# Patient Record
Sex: Female | Born: 1965 | Hispanic: No | Marital: Married | Smoking: Never smoker
Health system: Southern US, Community
[De-identification: ages and names within clinical notes are randomized; demographics above are authoritative.]

## PROBLEM LIST (undated history)

## (undated) DIAGNOSIS — K219 Gastro-esophageal reflux disease without esophagitis: Secondary | ICD-10-CM

## (undated) HISTORY — DX: Gastro-esophageal reflux disease without esophagitis: K21.9

---

## 2005-07-06 ENCOUNTER — Ambulatory Visit: Payer: Self-pay

## 2006-04-18 ENCOUNTER — Ambulatory Visit: Payer: Self-pay

## 2006-09-06 ENCOUNTER — Ambulatory Visit: Payer: Self-pay | Admitting: Surgery

## 2007-10-24 ENCOUNTER — Ambulatory Visit: Payer: Self-pay | Admitting: Internal Medicine

## 2007-11-17 ENCOUNTER — Emergency Department: Payer: Self-pay | Admitting: Emergency Medicine

## 2008-01-29 ENCOUNTER — Ambulatory Visit: Payer: Self-pay | Admitting: Internal Medicine

## 2008-01-31 ENCOUNTER — Ambulatory Visit: Payer: Self-pay | Admitting: Internal Medicine

## 2008-10-23 ENCOUNTER — Ambulatory Visit: Payer: Self-pay | Admitting: Internal Medicine

## 2009-12-15 ENCOUNTER — Ambulatory Visit: Payer: Self-pay

## 2011-03-08 ENCOUNTER — Ambulatory Visit: Payer: Self-pay

## 2011-09-08 ENCOUNTER — Ambulatory Visit (INDEPENDENT_AMBULATORY_CARE_PROVIDER_SITE_OTHER): Payer: BC Managed Care – PPO | Admitting: Internal Medicine

## 2011-09-08 ENCOUNTER — Encounter: Payer: Self-pay | Admitting: Internal Medicine

## 2011-09-08 DIAGNOSIS — N911 Secondary amenorrhea: Secondary | ICD-10-CM | POA: Insufficient documentation

## 2011-09-08 DIAGNOSIS — N912 Amenorrhea, unspecified: Secondary | ICD-10-CM

## 2011-09-08 LAB — POCT URINE PREGNANCY: Preg Test, Ur: NEGATIVE

## 2011-09-08 NOTE — Patient Instructions (Addendum)
I am repeating your liver tests and thyroid tests and checking your [facility]?LH to see if you are entering menopause  You can lower your triglycerides by reducing starches in your diet  Try [employer]  brand pita bread and flatbread ([employer] and [employer])

## 2011-09-08 NOTE — Progress Notes (Signed)
  Subjective:    Patient ID: Laura Holden, female    DOB: 01-10-1966, 46 y.o.   MRN: [id number]  HPI  46 yo woman presetns with secondary amenorrhea.  Had a normal menses in  dec, hasn't had one since then.  UPT was negative two weeks ago.   No breast tenderness or discharge, just back pain that comes and goes, improves with rest. No urinary symptoms. Last PAP/pelvic 2 years ago   TSH was elevated in October no repeat done.   No change in weight .  History of invitro fertilizatin over 11 yrs ago resulting in successful pregnancy,  Had a second child conceived and carried to term without hormonal manipulation .  No past medical history on file.  No current outpatient prescriptions on file prior to visit.      Review of Systems  Constitutional: Negative for fever, chills and unexpected weight change.  HENT: Negative for hearing loss, ear pain, nosebleeds, congestion, sore throat, facial swelling, rhinorrhea, sneezing, mouth sores, trouble swallowing, neck pain, neck stiffness, voice change, postnasal drip, sinus pressure, tinnitus and ear discharge.   Eyes: Negative for pain, discharge, redness and visual disturbance.  Respiratory: Negative for cough, chest tightness, shortness of breath, wheezing and stridor.   Cardiovascular: Negative for chest pain, palpitations and leg swelling.  Genitourinary: Positive for menstrual problem.  Musculoskeletal: Negative for myalgias and arthralgias.  Skin: Negative for color change and rash.  Neurological: Negative for dizziness, weakness, light-headedness and headaches.  Hematological: Negative for adenopathy.       Objective:   Physical Exam  Constitutional: She is oriented to person, place, and time. She appears well-developed and well-nourished.  HENT:  Mouth/Throat: Oropharynx is clear and moist.  Eyes: EOM are normal. Pupils are equal, round, and reactive to light. No scleral icterus.  Neck: Normal range of motion. Neck supple. No JVD present.  No thyromegaly present.  Cardiovascular: Normal rate, regular rhythm, normal heart sounds and intact distal pulses.   Pulmonary/Chest: Effort normal and breath sounds normal.  Abdominal: Soft. Bowel sounds are normal. She exhibits no mass. There is no tenderness.  Musculoskeletal: Normal range of motion. She exhibits no edema.  Lymphadenopathy:    She has no cervical adenopathy.  Neurological: She is alert and oriented to person, place, and time.  Skin: Skin is warm and dry.  Psychiatric: She has a normal mood and affect.          Assessment & Plan:

## 2011-09-08 NOTE — Assessment & Plan Note (Signed)
Likely secondary to perimenopausal state. Which she has a history of elevated TSH screening in October. We'll repeat TSH free T4 FSH and LH. Repeat urine test today was negative.

## 2011-09-09 ENCOUNTER — Other Ambulatory Visit: Payer: Self-pay | Admitting: Internal Medicine

## 2011-09-09 LAB — HEPATIC FUNCTION PANEL A (ARMC)
Bilirubin, Direct: <0.1 mg/dL (ref 0.00–0.20)
Bilirubin,Total: 0.6 mg/dL (ref 0.2–1.0)
SGOT(AST): 41 U/L — ABNORMAL HIGH (ref 15–37)
SGPT (ALT): 65 U/L

## 2011-09-14 ENCOUNTER — Telehealth: Payer: Self-pay | Admitting: Internal Medicine

## 2011-09-14 NOTE — Telephone Encounter (Signed)
Patient wants lab results

## 2011-09-16 NOTE — Telephone Encounter (Signed)
Pt called wanting to get lab results [phone]

## 2011-09-19 NOTE — Telephone Encounter (Signed)
Spoke w/pt - She is requesting results of labs from last OV. She states that she went to the hospital to get them drawn. Have you seen these? Do you have access to these?   (OK to leave information per pt, w/her husband at home OR leave a detailed Vm at home #)

## 2011-09-19 NOTE — Telephone Encounter (Signed)
NO I have not seen her labs, I will try to pull them up on [facility]

## 2011-09-20 NOTE — Telephone Encounter (Signed)
Her thyroid tests were not redone.  i was pretty sure i ordered them on the [facility] lab sheet.  Her  Female hormones do suggest perimenopause,  And one of her liver enzymes is elevated by just a few pots which can happen for lots of different reasons,  Including  Fatty liver.  I would like to repeat a hepatic panel in one month (along with TSH and free T4) and if still elevated will take a closer look at her liver.  In the meantime work on the diet to lower her triglycerides with diet and exercise

## 2011-09-21 NOTE — Telephone Encounter (Signed)
Order faxed to pt's home address.

## 2011-09-21 NOTE — Telephone Encounter (Signed)
Advised pt of lab results.  She would like an order for her one month follow up labs mailed to her home address so that she can take it to [facility].  Order form is in your red folder.

## 2011-10-12 ENCOUNTER — Other Ambulatory Visit: Payer: Self-pay | Admitting: Internal Medicine

## 2011-10-12 LAB — HEPATIC FUNCTION PANEL A (ARMC)
Albumin: 3.7 g/dL (ref 3.4–5.0)
Bilirubin, Direct: <0.1 mg/dL (ref 0.00–0.20)

## 2011-10-17 ENCOUNTER — Telehealth: Payer: Self-pay | Admitting: Internal Medicine

## 2011-10-17 NOTE — Telephone Encounter (Signed)
Left message asking patient to return call. 

## 2011-10-17 NOTE — Telephone Encounter (Signed)
Repeat thyroid function panel normal  lfts are normal too. If her periods are still absent, she may be perimenopausal

## 2011-10-18 NOTE — Telephone Encounter (Signed)
Left another message asking patient to call back

## 2011-10-18 NOTE — Telephone Encounter (Signed)
Patient notified of thyroid function and lfts. Patient says that she has now got her period.

## 2011-10-21 ENCOUNTER — Encounter: Payer: Self-pay | Admitting: Internal Medicine

## 2011-12-14 ENCOUNTER — Ambulatory Visit: Payer: Self-pay | Admitting: Internal Medicine

## 2011-12-14 ENCOUNTER — Ambulatory Visit (INDEPENDENT_AMBULATORY_CARE_PROVIDER_SITE_OTHER): Payer: BC Managed Care – PPO | Admitting: Internal Medicine

## 2011-12-14 ENCOUNTER — Encounter: Payer: Self-pay | Admitting: Internal Medicine

## 2011-12-14 VITALS — BP 108/60 | HR 87 | Temp 98.5°F | Resp 14 | Wt 139.8 lb

## 2011-12-14 DIAGNOSIS — R51 Headache: Secondary | ICD-10-CM

## 2011-12-14 DIAGNOSIS — M542 Cervicalgia: Secondary | ICD-10-CM

## 2011-12-14 LAB — POCT URINE PREGNANCY: Preg Test, Ur: NEGATIVE

## 2011-12-14 NOTE — Progress Notes (Signed)
Patient ID: Laura Holden, female   DOB: [birth year], 46 y.o.   MRN: [id number]   Patient Active Problem List  Diagnoses  . Secondary amenorrhea  . GERD (gastroesophageal reflux disease)  . Headache, chronic daily    Subjective:  CC:   Chief Complaint  Patient presents with  . Headache    x one month    HPI:   Laura Holden a 46 y.o. female who presents fo evaluation of recurrent daily headaches.  Occurring occasionally  the middle of the night,  aggravated by turning head to the left.  Always left sided,  Not severe,  Not taking any medications that could  be causing it and has not taken NSAIDs or other pain relievers on a regular basis. No visual changes no nausea no shortness of breath  .   Past Medical History  Diagnosis Date  . GERD (gastroesophageal reflux disease)     History reviewed. No pertinent past surgical history.       The following portions of the patient's history were reviewed and updated as appropriate: Allergies, current medications, and problem list.    Review of Systems:   12 Pt  review of systems was negative except those addressed in the HPI,     History   Social History  . Marital Status: Married    Spouse Name: N/A    Number of Children: N/A  . Years of Education: N/A   Occupational History  . Not on file.   Social History Main Topics  . Smoking status: Never Smoker   . Smokeless tobacco: Never Used  . Alcohol Use: No  . Drug Use: No  . Sexually Active: Not on file   Other Topics Concern  . Not on file   Social History Narrative  . No narrative on file    Objective:  BP 108/60  Pulse 87  Temp(Src) 98.5 F (36.9 C) (Oral)  Resp 14  Wt 139 lb 12 oz (63.39 kg)  SpO2 98%  LMP 11/07/2011  General appearance: alert, cooperative and appears stated age Ears: normal TM's and external ear canals both ears Throat: lips, mucosa, and tongue normal; teeth and gums normal Neck: no adenopathy, no carotid bruit, supple,  symmetrical, trachea midline and thyroid not enlarged, symmetric, no tenderness/mass/nodules Back: symmetric, no curvature. ROM normal. No CVA tenderness. Lungs: clear to auscultation bilaterally Heart: regular rate and rhythm, S1, S2 normal, no murmur, click, rub or gallop Abdomen: soft, non-tender; bowel sounds normal; no masses,  no organomegaly Pulses: 2+ and symmetric Skin: Skin color, texture, turgor normal. No rashes or lesions Lymph nodes: Cervical, supraclavicular, and axillary nodes normal.  Assessment and Plan:  Headache, chronic daily  I suspect because of her headaches is degenerative changes in the cervical spine as well as her headaches originate in the occipital area and are aggravated by sleeping without a pillow .  The headaches are not severe pain they're not accompanied by visual changes. Her neuropathy her neurological exam is completely normal . She had an MRI of the brain 3 years ago for similar occurrence in the MRI was normal. We will start with plain films of the cervical spine and if there are signs of disc disc disease I recommended that she get an MRI of her spine while she was in [place] next month.    Updated Medication List Outpatient Encounter Prescriptions as of 12/14/2011  Medication Sig Dispense Refill  . Lansoprazole (PREVACID PO) Take one by mouth daily as needed, pt not  sure of dose.         Orders Placed This Encounter  Procedures  . DG Cervical Spine Complete  . POCT urine pregnancy    No Follow-up on file.

## 2011-12-14 NOTE — Patient Instructions (Signed)
I think your headaches may be coming from a problem with a disk in your neck.  I am ordering plain x rays of your cervcal spine.  If they are abnormal.,  You may want to get a cervical spine MRI while you are in [place].  I advise using a pillow at night that provides neck support

## 2011-12-15 ENCOUNTER — Encounter: Payer: Self-pay | Admitting: Internal Medicine

## 2011-12-15 DIAGNOSIS — K219 Gastro-esophageal reflux disease without esophagitis: Secondary | ICD-10-CM | POA: Insufficient documentation

## 2011-12-15 NOTE — Assessment & Plan Note (Signed)
I suspect because of her headaches is degenerative changes in the cervical spine as well as her headaches originate in the occipital area and are aggravated by sleeping without a pillow .  The headaches are not severe pain they're not accompanied by visual changes. Her neuropathy her neurological exam is completely normal . She had an MRI of the brain 3 years ago for similar occurrence in the MRI was normal. We will start with plain films of the cervical spine and if there are signs of disc disc disease I recommended that she get an MRI of her spine while she was in [place] next month.

## 2011-12-16 ENCOUNTER — Telehealth: Payer: Self-pay | Admitting: Internal Medicine

## 2011-12-16 NOTE — Telephone Encounter (Signed)
Her neck x rays were normal .  If she is willing to try a 6 day course of prednisone to see if it helps resolve her persistent ,  We can call her in 6 day dose pack.

## 2011-12-16 NOTE — Telephone Encounter (Signed)
Patient wanting the results of her x-rays today.

## 2011-12-16 NOTE — Telephone Encounter (Signed)
Patient notified.  She does not want the prednisone.

## 2011-12-21 ENCOUNTER — Encounter: Payer: Self-pay | Admitting: Internal Medicine

## 2012-05-01 ENCOUNTER — Ambulatory Visit: Payer: Self-pay | Admitting: Internal Medicine

## 2012-05-02 ENCOUNTER — Ambulatory Visit: Payer: Self-pay | Admitting: Internal Medicine

## 2012-05-03 ENCOUNTER — Telehealth: Payer: Self-pay | Admitting: Internal Medicine

## 2012-05-03 NOTE — Telephone Encounter (Signed)
Good news. The extra views of her left breast were normal; no masses or calcifications. We will resume annual screening mammograms.

## 2012-05-03 NOTE — Telephone Encounter (Signed)
Spoke to patient notified her of mammogram results.

## 2012-05-08 ENCOUNTER — Encounter: Payer: Self-pay | Admitting: Internal Medicine

## 2012-08-10 ENCOUNTER — Other Ambulatory Visit: Payer: Self-pay | Admitting: Otolaryngology

## 2012-08-10 DIAGNOSIS — H9190 Unspecified hearing loss, unspecified ear: Secondary | ICD-10-CM

## 2012-08-15 ENCOUNTER — Ambulatory Visit
Admission: RE | Admit: 2012-08-15 | Discharge: 2012-08-15 | Disposition: A | Discharge status: Home / Self Care | Payer: No Typology Code available for payment source | Source: Ambulatory Visit | Attending: Otolaryngology | Admitting: Otolaryngology

## 2012-08-15 DIAGNOSIS — H9190 Unspecified hearing loss, unspecified ear: Secondary | ICD-10-CM

## 2012-08-15 MED ORDER — GADOBENATE DIMEGLUMINE 529 MG/ML IV SOLN
12.0000 mL | Freq: Once | INTRAVENOUS | Status: AC | PRN
  Administered 2012-08-15: 12 mL via INTRAVENOUS

## 2012-08-16 ENCOUNTER — Other Ambulatory Visit: Payer: BC Managed Care – PPO

## 2012-10-16 ENCOUNTER — Telehealth: Payer: Self-pay | Admitting: Internal Medicine

## 2012-10-16 NOTE — Telephone Encounter (Signed)
Is it ok if labs are done per an outside provider?

## 2012-10-16 NOTE — Telephone Encounter (Signed)
Neurologist wants to do some labs on this patient she has the order. The patient wants the labs done under her physical so she will not have to pay. She has not set up a physical yet.

## 2012-10-16 NOTE — Telephone Encounter (Signed)
I am happy to order whatever the neurologist wants, if the patient thinks they will not be paid unless she has them doneat the physical then she can set up her physical

## 2012-10-17 NOTE — Telephone Encounter (Signed)
Pt has a CPE appt on 4/10.

## 2012-11-08 ENCOUNTER — Encounter: Payer: Self-pay | Admitting: Internal Medicine

## 2012-12-17 ENCOUNTER — Ambulatory Visit (INDEPENDENT_AMBULATORY_CARE_PROVIDER_SITE_OTHER): Payer: BC Managed Care – PPO | Admitting: Adult Health

## 2012-12-17 ENCOUNTER — Encounter: Payer: Self-pay | Admitting: Adult Health

## 2012-12-17 VITALS — BP 110/70 | HR 71 | Resp 12 | Wt 139.5 lb

## 2012-12-17 DIAGNOSIS — N63 Unspecified lump in unspecified breast: Secondary | ICD-10-CM

## 2012-12-17 DIAGNOSIS — N6320 Unspecified lump in the left breast, unspecified quadrant: Secondary | ICD-10-CM

## 2012-12-17 NOTE — Progress Notes (Signed)
  Subjective:    Patient ID: Olayinka Gathers, female    DOB: Nov 10, 1965, 47 y.o.   MRN: [id number]  HPI  Patient is a pleasant 47 year old female who presents to clinic for left breast lump. She first noticed this lump a few days ago. Last mammogram was in June or July of last year. She denies any changes of the skin, drainage from the nipple or inversion of the nipple. Patient denies dimpling.   Current Outpatient Prescriptions on File Prior to Visit  Medication Sig Dispense Refill  . Lansoprazole (PREVACID PO) Take one by mouth daily as needed, pt not sure of dose.       No current facility-administered medications on file prior to visit.     Review of Systems  Breast: left breast lump. No dimpling, nipple inversion or discharge  BP 110/70  Pulse 71  Resp 12  Wt 139 lb 8 oz (63.277 kg)  BMI 26.37 kg/m2  SpO2 99%    Objective:   Physical Exam   Breast: Left breast with palpable mass at 6 o'clock. Mass is movable. No skin changes. No dimpling or nipple drainage noted. No palpable lymph nodes.     Assessment & Plan:

## 2012-12-17 NOTE — Patient Instructions (Addendum)
  We are scheduling an ultrasound and diagnostic mammography for your left breast lump.  Amber will schedule your appointment.

## 2012-12-17 NOTE — Assessment & Plan Note (Signed)
Mammogram, bilateral diagnostic. Left breast ultrasound.

## 2012-12-20 ENCOUNTER — Other Ambulatory Visit: Payer: Self-pay | Admitting: *Deleted

## 2012-12-20 ENCOUNTER — Ambulatory Visit: Payer: Self-pay | Admitting: Internal Medicine

## 2012-12-20 DIAGNOSIS — N6320 Unspecified lump in the left breast, unspecified quadrant: Secondary | ICD-10-CM

## 2012-12-21 ENCOUNTER — Telehealth: Payer: Self-pay | Admitting: Internal Medicine

## 2012-12-21 ENCOUNTER — Other Ambulatory Visit: Payer: Self-pay | Admitting: Internal Medicine

## 2012-12-21 DIAGNOSIS — N6320 Unspecified lump in the left breast, unspecified quadrant: Secondary | ICD-10-CM

## 2012-12-21 MED ORDER — ALPRAZOLAM 0.25 MG PO TABS: 0.2500 mg | ORAL_TABLET | Freq: Every evening | ORAL | Status: AC | PRN

## 2012-12-21 NOTE — Assessment & Plan Note (Signed)
Patient notified by Dr Darrick Huntsman that the diagnostic imaging was inconcslusiive of breast vs mass and biopsy of left inferior breast mass recommended.  Referral to Dr. Lemar Livings underway

## 2012-12-21 NOTE — Telephone Encounter (Signed)
I just pit these results in lab folder at 4 pm. Will release after you release them.

## 2012-12-21 NOTE — Telephone Encounter (Signed)
Patient needing her results of her mammogram today. She does not want to wait over a 3 day weekend for her results.

## 2012-12-21 NOTE — Telephone Encounter (Signed)
Patient notified by Dr Darrick Huntsman.  Referral to Dr Lemar Livings in place,.  Alprazolam called to [facility]

## 2012-12-25 NOTE — Telephone Encounter (Signed)
I referred patient for a diagnostic mammography. My understanding of "diagnostic" is that the radiologist discusses findings with the patient while she is still there. I;m not exactly sure why that did not occur. Thank you for follow-up.

## 2013-01-01 ENCOUNTER — Ambulatory Visit: Payer: Self-pay | Admitting: General Surgery

## 2013-01-02 ENCOUNTER — Telehealth: Payer: Self-pay | Admitting: Internal Medicine

## 2013-01-02 DIAGNOSIS — N6320 Unspecified lump in the left breast, unspecified quadrant: Secondary | ICD-10-CM

## 2013-01-02 NOTE — Telephone Encounter (Signed)
Please call patient tofind out why she cancelled the referral to Dr Evette Cristal for breast biopsy.

## 2013-01-02 NOTE — Telephone Encounter (Signed)
Left message for patient to return call to office on home voicemail.

## 2013-01-02 NOTE — Assessment & Plan Note (Signed)
FNA done by Loraine Leriche bird was incomplete.  Biopsy planned for June 8th

## 2013-01-02 NOTE — Telephone Encounter (Signed)
Patient reported she cancelled with Dr. Evette Cristal only because she could see Dr. Colette Ribas at [facility] quicker and she has had a sample taken but was not large enough so she is scheduled for a biopsy next week.

## 2013-01-15 ENCOUNTER — Ambulatory Visit (INDEPENDENT_AMBULATORY_CARE_PROVIDER_SITE_OTHER): Payer: BC Managed Care – PPO | Admitting: Internal Medicine

## 2013-01-15 ENCOUNTER — Encounter: Payer: Self-pay | Admitting: Internal Medicine

## 2013-01-15 VITALS — BP 98/60 | HR 75 | Temp 98.0°F | Resp 16 | Ht 62.0 in | Wt 141.0 lb

## 2013-01-15 DIAGNOSIS — N63 Unspecified lump in unspecified breast: Secondary | ICD-10-CM

## 2013-01-15 DIAGNOSIS — R5381 Other malaise: Secondary | ICD-10-CM

## 2013-01-15 DIAGNOSIS — M25519 Pain in unspecified shoulder: Secondary | ICD-10-CM

## 2013-01-15 DIAGNOSIS — N6320 Unspecified lump in the left breast, unspecified quadrant: Secondary | ICD-10-CM

## 2013-01-15 DIAGNOSIS — N912 Amenorrhea, unspecified: Secondary | ICD-10-CM

## 2013-01-15 DIAGNOSIS — M25511 Pain in right shoulder: Secondary | ICD-10-CM

## 2013-01-15 DIAGNOSIS — N920 Excessive and frequent menstruation with regular cycle: Secondary | ICD-10-CM

## 2013-01-15 DIAGNOSIS — N911 Secondary amenorrhea: Secondary | ICD-10-CM

## 2013-01-15 DIAGNOSIS — R5383 Other fatigue: Secondary | ICD-10-CM

## 2013-01-15 DIAGNOSIS — Z0000 Encounter for general adult medical examination without abnormal findings: Secondary | ICD-10-CM

## 2013-01-15 DIAGNOSIS — E559 Vitamin D deficiency, unspecified: Secondary | ICD-10-CM

## 2013-01-15 LAB — POCT URINE PREGNANCY: Preg Test, Ur: NEGATIVE

## 2013-01-15 NOTE — Patient Instructions (Addendum)
I did not feel a lump in your left breast either, but I would recommend getting an ultrasound  To be sure.   f you get your left breast ultrasound in [place],  Ask for a copy.  If you have a PAP smear in [place],  Have them send [place] a copy   Yo do not need a PAP smear more than every 3 years to screen for cervical Ca  if your previous PAPs have been normal   bloodwork from today will be sent to Dr Sherryll Burger

## 2013-01-15 NOTE — Progress Notes (Addendum)
Patient ID: Laura Holden, female   DOB: 1965-08-11, 47 y.o.   MRN: [id number]    Subjective:     Laura Holden is a 47 y.o. female and is here for a comprehensive physical exam. The patient reports  the following issues. Annual exam was planned but her menses have resumed.  Her usual menses last 3 to 4 days,  But her last normal period was 3 months ago.  Currently bleeding for one week. Has had unprotected sex, but has had no breast tenderness, nausea or other symptoms of pregnancy.   Last week she had a lump in her left breast mass aspirated with too scant cellularity so a biopsy was planned, her mass has disappeared and she is requesting an exam by me.  Left shoulder pain in top of biceps  groove aggravated by internation rotation and flexion to 180 degrees.,  No history of trauma,  But had an IM injection of vaccine several months prior in October and pain has been present for 3 to 4 months    History   Social History  . Marital Status: Married    Spouse Name: N/A    Number of Children: N/A  . Years of Education: N/A   Occupational History  . Not on file.   Social History Main Topics  . Smoking status: Never Smoker   . Smokeless tobacco: Never Used  . Alcohol Use: No  . Drug Use: No  . Sexually Active: Not on file   Other Topics Concern  . Not on file   Social History Narrative  . No narrative on file   Health Maintenance  Topic Date Due  . Pap Smear  12/29/1983  . Tetanus/tdap  12/28/1984  . Influenza Vaccine  04/01/2013    The following portions of the patient's history were reviewed and updated as appropriate: allergies, current medications, past family history, past medical history, past social history, past surgical history and problem list.  Review of Systems A comprehensive review of systems was negative.   Objective:   BP 98/60  Pulse 75  Temp(Src) 98 F (36.7 C) (Oral)  Resp 16  Ht 5\' 2"  (1.575 m)  Wt 141 lb (63.957 kg)  BMI 25.78 kg/m2  SpO2 98%   LMP 01/08/2013   General Appearance:    Alert, cooperative, no distress, appears stated age  Head:    Normocephalic, without obvious abnormality, atraumatic  Eyes:    PERRL, conjunctiva/corneas clear, EOM's intact, fundi    benign, both eyes  Ears:    Normal TM's and external ear canals, both ears  Nose:   Nares normal, septum midline, mucosa normal, no drainage    or sinus tenderness  Throat:   Lips, mucosa, and tongue normal; teeth and gums normal  Neck:   Supple, symmetrical, trachea midline, no adenopathy;    thyroid:  no enlargement/tenderness/nodules; no carotid   bruit or JVD  Back:     Symmetric, no curvature, ROM normal, no CVA tenderness  Lungs:     Clear to auscultation bilaterally, respirations unlabored  Chest Wall:    No tenderness or deformity   Heart:    Regular rate and rhythm, S1 and S2 normal, no murmur, rub   or gallop  Breast Exam:    No tenderness, masses, or nipple abnormality  Abdomen:     Soft, non-tender, bowel sounds active all four quadrants,    no masses, no organomegaly     Extremities:   Extremities normal, atraumatic, no cyanosis or  edema  Pulses:   2+ and symmetric all extremities  Skin:   Skin color, texture, turgor normal, no rashes or lesions  Lymph nodes:   Cervical, supraclavicular, and axillary nodes normal  Neurologic:   CNII-XII intact, normal strength, sensation and reflexes    throughout    .    Assessment:   Left breast mass No longer present on exam. And patient is leaving for [place] soon .  Recommended that she have a repeat ultrasound   Secondary amenorrhea Urine pregnancy test was negative. Thyroid is normal.  hgb is normal.   Pain in joint, shoulder region Exam is consistent with subacromial bursitis .  NSAIDs, PT   Routine general medical examination at a health care facility Annual comprehensive exam was done including breast exam. All screenings have been addressed .    Updated Medication List Outpatient Encounter  Prescriptions as of 01/15/2013  Medication Sig Dispense Refill  . ALPRAZolam (XANAX) 0.25 MG tablet Take 1 tablet (0.25 mg total) by mouth at bedtime as needed for sleep or anxiety.  30 tablet  0  . Lansoprazole (PREVACID PO) Take one by mouth daily as needed, pt not sure of dose.       No facility-administered encounter medications on file as of 01/15/2013.

## 2013-01-16 ENCOUNTER — Other Ambulatory Visit: Payer: Self-pay | Admitting: Internal Medicine

## 2013-01-16 ENCOUNTER — Encounter: Payer: Self-pay | Admitting: *Deleted

## 2013-01-16 ENCOUNTER — Encounter: Payer: Self-pay | Admitting: Internal Medicine

## 2013-01-16 DIAGNOSIS — M25519 Pain in unspecified shoulder: Secondary | ICD-10-CM | POA: Insufficient documentation

## 2013-01-16 DIAGNOSIS — E876 Hypokalemia: Secondary | ICD-10-CM

## 2013-01-16 DIAGNOSIS — Z0000 Encounter for general adult medical examination without abnormal findings: Secondary | ICD-10-CM | POA: Insufficient documentation

## 2013-01-16 LAB — COMPREHENSIVE METABOLIC PANEL
CO2: 24 mEq/L (ref 19–32)
Calcium: 8.8 mg/dL (ref 8.4–10.5)
Chloride: 97 mEq/L (ref 96–112)
Creatinine, Ser: 0.8 mg/dL (ref 0.4–1.2)
GFR: 81.7 mL/min (ref >60.00)
Glucose, Bld: 79 mg/dL (ref 70–99)
Total Bilirubin: 0.7 mg/dL (ref 0.3–1.2)
Total Protein: 7.1 g/dL (ref 6.0–8.3)

## 2013-01-16 LAB — CBC WITH DIFFERENTIAL/PLATELET
Basophils Absolute: 0.1 10*3/uL (ref 0.0–0.1)
Eosinophils Absolute: 1 10*3/uL — ABNORMAL HIGH (ref 0.0–0.7)
HCT: 35.4 % — ABNORMAL LOW (ref 36.0–46.0)
Lymphs Abs: 2.2 10*3/uL (ref 0.7–4.0)
MCHC: 33.2 g/dL (ref 30.0–36.0)
Monocytes Relative: 5.5 % (ref 3.0–12.0)
Neutro Abs: 5.3 10*3/uL (ref 1.4–7.7)
Platelets: 217 10*3/uL (ref 150.0–400.0)
RDW: 14 % (ref 11.5–14.6)

## 2013-01-16 LAB — VITAMIN D 25 HYDROXY (VIT D DEFICIENCY, FRACTURES): Vit D, 25-Hydroxy: 27 ng/mL — ABNORMAL LOW (ref 30–89)

## 2013-01-16 LAB — SEDIMENTATION RATE: Sed Rate: 19 mm/hr (ref 0–22)

## 2013-01-16 LAB — TSH: TSH: 4.23 u[IU]/mL (ref 0.35–5.50)

## 2013-01-16 MED ORDER — POTASSIUM CHLORIDE CRYS ER 10 MEQ PO TBCR: 20.0000 meq | EXTENDED_RELEASE_TABLET | Freq: Every day | ORAL | Status: AC

## 2013-01-16 NOTE — Assessment & Plan Note (Signed)
Urine pregnancy test was negative. Thyroid is normal.  hgb is normal.

## 2013-01-16 NOTE — Assessment & Plan Note (Signed)
Annual comprehensive exam was done including breast exam .  All screenings have been addressed .  

## 2013-01-16 NOTE — Assessment & Plan Note (Addendum)
Exam is consistent with subacromial bursitis .  NSAIDs, PT

## 2013-01-16 NOTE — Assessment & Plan Note (Signed)
No longer present on exam. And patient is leaving for [place] soon .  Recommended that she have a repeat ultrasound

## 2013-01-18 LAB — METHYLMALONIC ACID(MMA), RND URINE
Creatinine: 32.63 mg/dL
Methylmalonic acid: 1.43 mmol/mol{creat} (ref <3.60)
Methylmalonic acid: 4.12 umol/L

## 2013-01-22 ENCOUNTER — Other Ambulatory Visit (INDEPENDENT_AMBULATORY_CARE_PROVIDER_SITE_OTHER): Payer: BC Managed Care – PPO

## 2013-01-22 DIAGNOSIS — R748 Abnormal levels of other serum enzymes: Secondary | ICD-10-CM

## 2013-01-22 DIAGNOSIS — E876 Hypokalemia: Secondary | ICD-10-CM

## 2013-01-22 LAB — COMPREHENSIVE METABOLIC PANEL
ALT: 63 U/L — ABNORMAL HIGH (ref 0–35)
AST: 50 U/L — ABNORMAL HIGH (ref 0–37)
Alkaline Phosphatase: 77 U/L (ref 39–117)
BUN: 13 mg/dL (ref 6–23)
Chloride: 103 mEq/L (ref 96–112)
Creatinine, Ser: 0.7 mg/dL (ref 0.4–1.2)
Total Bilirubin: 0.9 mg/dL (ref 0.3–1.2)

## 2013-01-23 DIAGNOSIS — R748 Abnormal levels of other serum enzymes: Secondary | ICD-10-CM | POA: Insufficient documentation

## 2013-01-23 NOTE — Addendum Note (Signed)
Addended by: Sherlene Shams on: 01/23/2013 12:53 PM   Modules accepted: Orders

## 2013-02-03 DIAGNOSIS — E559 Vitamin D deficiency, unspecified: Secondary | ICD-10-CM | POA: Insufficient documentation

## 2013-02-03 MED ORDER — VITAMIN D 50 MCG (2000 UT) PO TABS: 2000.0000 [IU] | ORAL_TABLET | Freq: Every day | ORAL | Status: AC

## 2013-02-03 NOTE — Addendum Note (Signed)
Addended by: Sherlene Shams on: 02/03/2013 06:57 AM   Modules accepted: Orders

## 2013-02-12 ENCOUNTER — Encounter: Payer: Self-pay | Admitting: Internal Medicine

## 2013-02-12 ENCOUNTER — Encounter: Payer: Self-pay | Admitting: *Deleted

## 2013-03-15 ENCOUNTER — Telehealth: Payer: Self-pay | Admitting: Internal Medicine

## 2013-03-15 NOTE — Telephone Encounter (Signed)
Patient called in to office today requesting [place] to look at [facility] 01/15/13 she states that she shouldn't have been billed for a breast exam. She was here for her physical and that Dr. Darrick Huntsman did exam her breast even though she had previously seen Raquel in regards to a lump she felt in her breast. I have sent an email to charge correction to make sure she was charged appropriately.

## 2013-03-28 NOTE — Telephone Encounter (Signed)
Laura Holden,  Please be sure and let us know when you have coding questions about a patients bill and that you refer them to [facility] or myself and not to [employer] as [employer] does not handle our billing any longer.  I want to be sure since we are educating the providers and doing the coding that we are supplying you with any questions you have.    This patient came in and according to the notes specifically asked Dr. Darrick Huntsman to exam her breast and based on the examination another ultrasound was scheduled.  In addition the patient had multiple complaints as Ephriam Knuckles mentioned below that were outside the work of a yearly preventative visit.  Hope this helps.    Thanks Darl Pikes  From: Adelene Idler  Sent: Wednesday, March 20, 2013 9:05 AM To: Cydney Ok Subject: FW: Hello   How would like me to respond?  I reviewed the note. Per review of note Dr. Darrick Huntsman documents Last week she had a lump in her left breast mass aspirated with too scant cellularity so a biopsy was planned, her mass has disappeared and she is requesting an exam by me.  In addition to this the patient presented with Left Shoulder pain, the doctor performed an exam. Left shoulder pain in top of biceps groove aggravated by internation rotation and flexion to 180 degrees., No history of trauma, But had an IM injection of vaccine several months prior in October and pain has been present for 3 to 4 months The charges that were submitted are correct.   Thanks  [employer]

## 2013-03-28 NOTE — Telephone Encounter (Signed)
I called the patient back after getting email from Darl Pikes in our billing department. I explained to patient that since she had discussed with Dr. Darrick Huntsman the left should pain that is why she received the additional office visit on top of her physical. Patient wasn't happy about this and stated that she should have not come for this appointment and very upset that she was charged two visits.

## 2014-01-08 ENCOUNTER — Other Ambulatory Visit: Payer: Self-pay | Admitting: *Deleted

## 2014-01-08 DIAGNOSIS — N6320 Unspecified lump in the left breast, unspecified quadrant: Secondary | ICD-10-CM

## 2014-01-10 ENCOUNTER — Other Ambulatory Visit: Payer: Self-pay | Admitting: *Deleted

## 2014-01-10 DIAGNOSIS — N63 Unspecified lump in unspecified breast: Secondary | ICD-10-CM

## 2014-03-29 IMAGING — MG MM MAMMO DIAGNOSTIC UNILATERAL*L*
1 series · 6 of 6 positions shown · non-contrast
Comparison: 05/01/2012, 03/08/2011, 12/15/2009.

REASON FOR EXAM: LT BRST LUMP 6 OCLOCK
COMMENTS:

PROCEDURE:     MAM - MAM DGTL UNI MAM LT BREAST W/CAD  - December 20, 2012  [DATE]
RESULT:

[L CC · left · 6 of 6 slices shown]
[im 1/6]
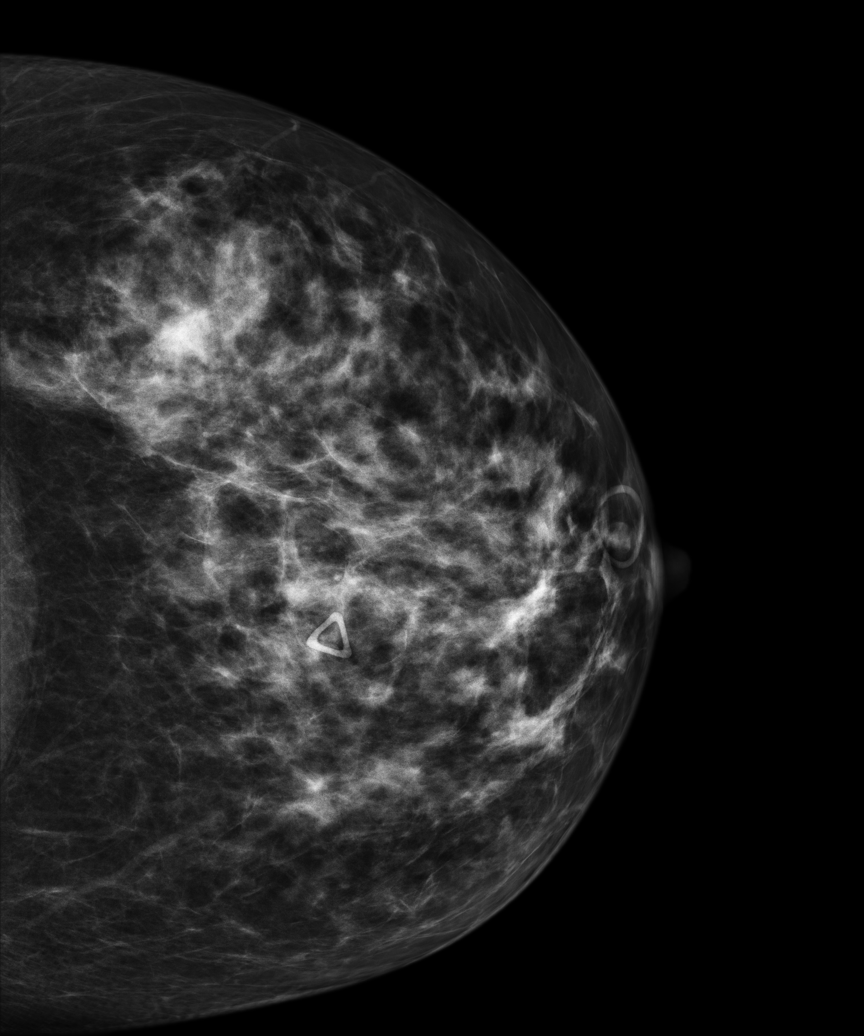
[im 2/6]
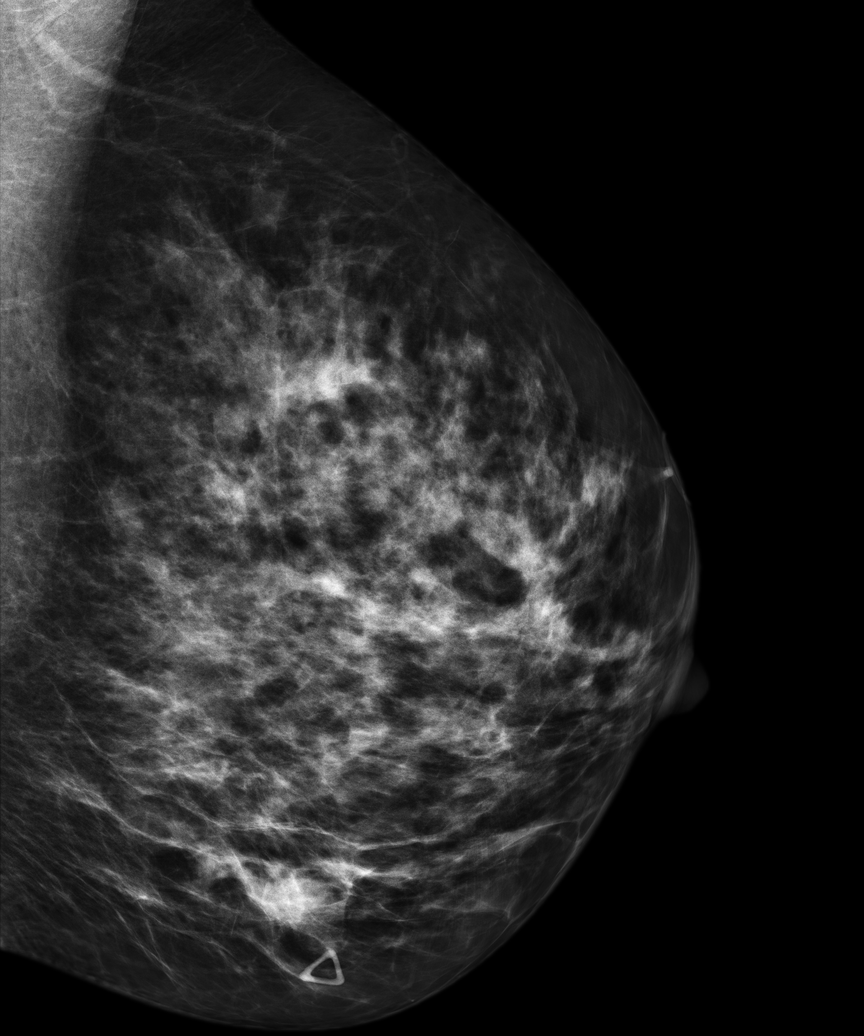
[im 3/6]
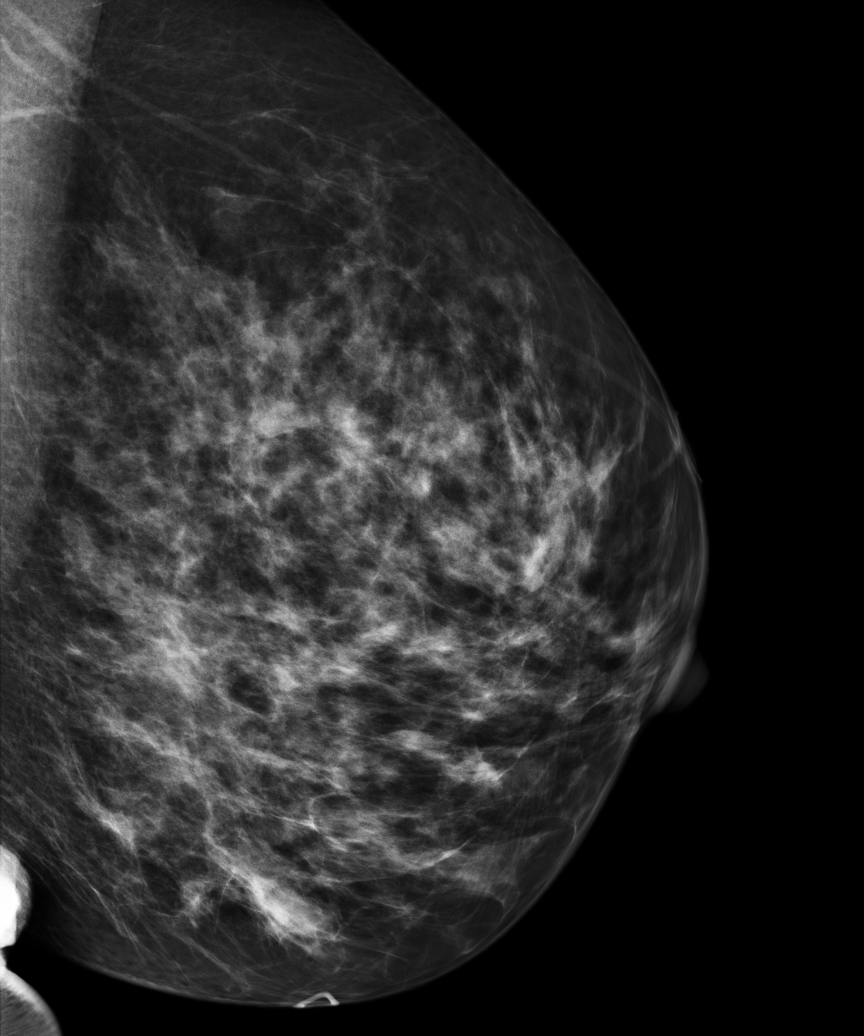
[im 4/6]
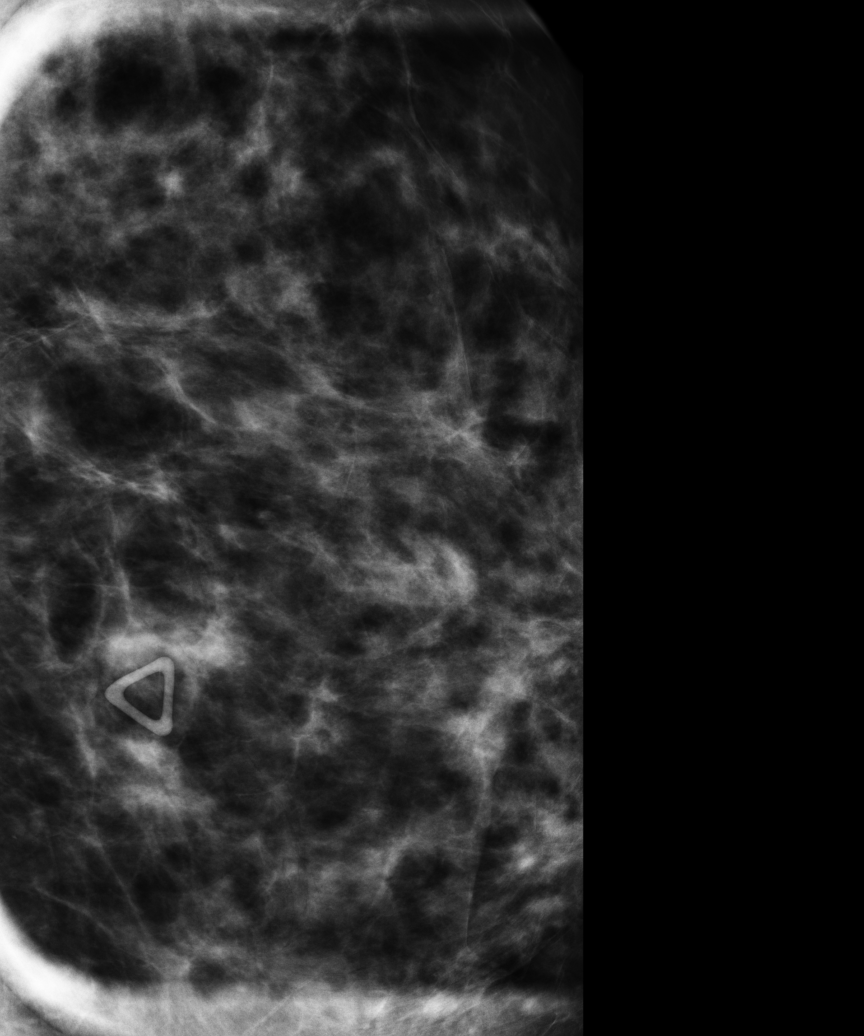
[im 5/6]
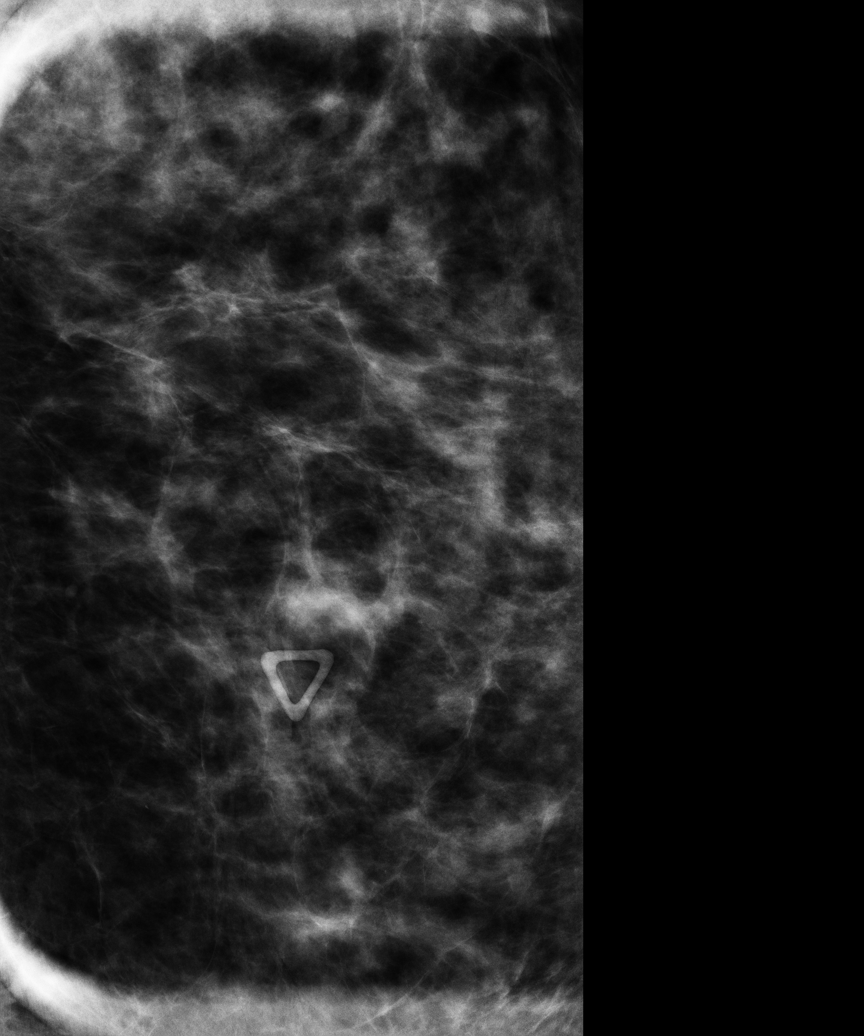
[im 6/6]
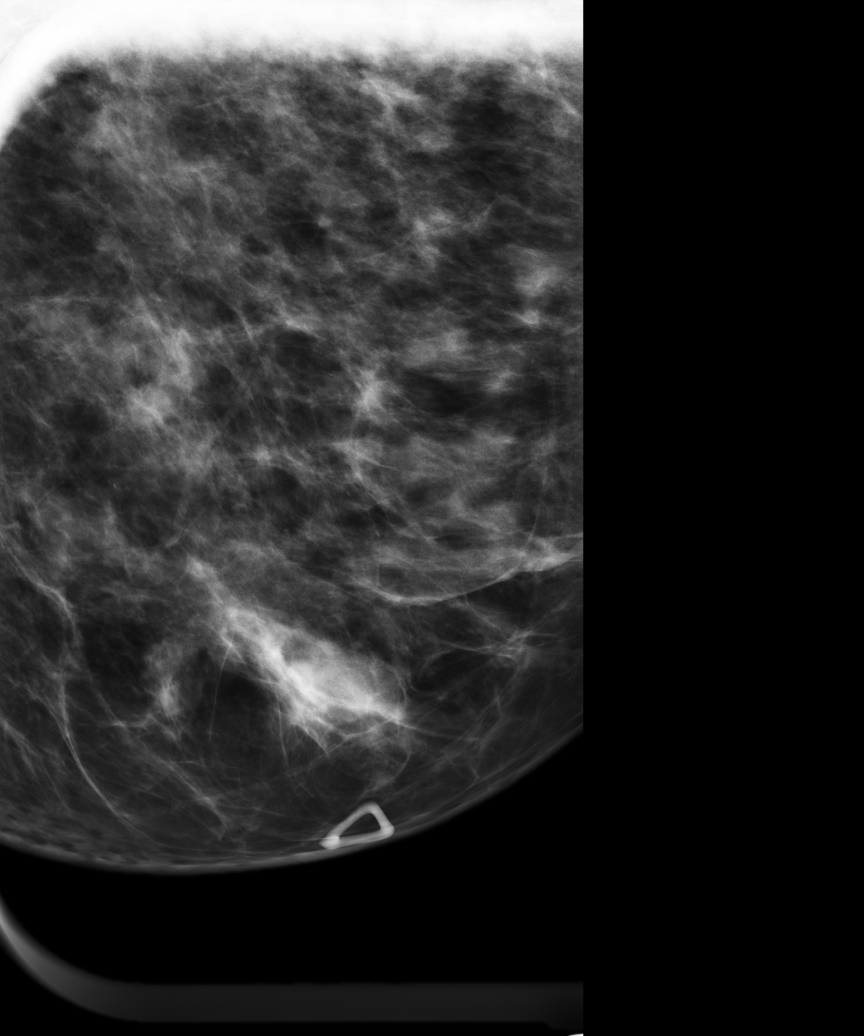

[6 of 6 positions shown; findings below may reference images not displayed]

FINDINGS: The breast tissue is heterogeneously dense, which may lower the sensitivity
of mammography. A palpable marker was placed along the inferior left breast
at approximately 6 o'clock secondary to reported palpable abnormality at
this site. Spot compression magnification views were performed of this
region. On the spot compression magnification MLO view, there is suggestion
of a mass at the site, though it is mostly obscured by overlapping
fibroglandular tissue. On the spot compression magnification CC view, there
appears to be only relatively dense fibroglandular tissue in this region.

Real-time ultrasound was performed of the inferior left breast at [DATE] at
the site of reported palpable abnormality. At this site, there is a round
mass which is hypoechoic, though nearly anechoic. There is a suggestion of
posterior acoustic though transmission, but this is not definitive. There is
some shadowing along the periphery of the mass and some of the borders are
somewhat ill-defined. The mass measures 1.0 x 0.8 x 0.7 cm.
IMPRESSION: 1.     BI-RADS: Category 4 - Suspicious Abnormality.
2.     There is a small mass in the inferior left breast at the site of
reported palpable abnormality which could represent a cyst, but this is not
definitive. Attempt at cyst aspiration is recommended, with potential for
biopsy if solid.

BREAST COMPOSITION:  The breast composition is HETEROGENEOUSLY DENSE
(glandular tissue is 51-75%). This may decrease the sensitivity of
mammography.

Thank you for this opportunity to contribute to the care of your patient.

A NEGATIVE MAMMOGRAM REPORT DOES NOT PRECLUDE BIOPSY OR OTHER EVALUATION OF
A CLINICALLY PALPABLE OR OTHERWISE SUSPICIOUS MASS OR LESION. BREAST CANCER
MAY NOT BE DETECTED BY MAMMOGRAPHY IN UP TO 10% OF CASES.

## 2014-03-29 IMAGING — US ULTRASOUND LEFT BREAST
1 series · 13 of 17 positions shown · non-contrast
Comparison: 05/01/2012, 03/08/2011, 12/15/2009.

REASON FOR EXAM: LT BRST LUMP 6 OCLOCK
COMMENTS:

PROCEDURE:     US  - US BREAST LEFT  - December 20, 2012  [DATE]
RESULT:

[Series 1: ultrasound left breast · 0.08mm/px · 13 of 17 slices shown]
[im 1/17]
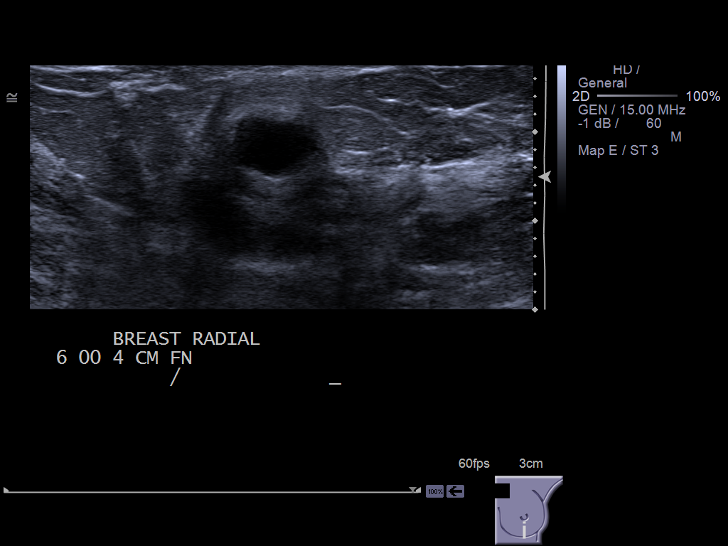
[im 2/17]
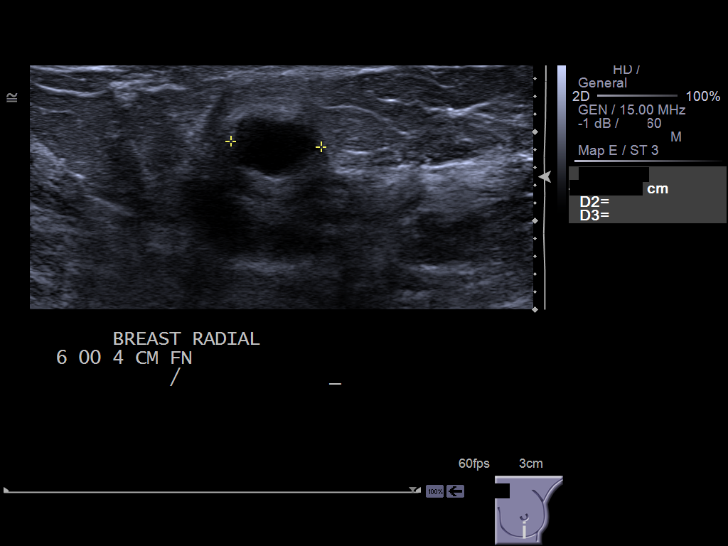
[im 4/17]
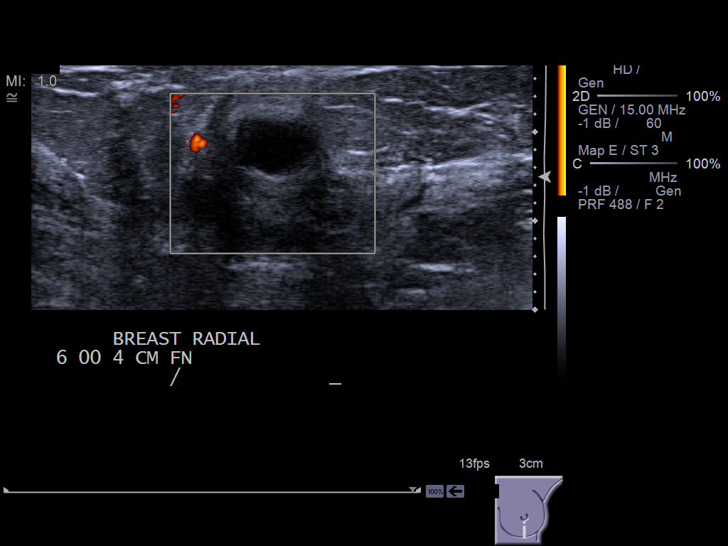
[im 5/17]
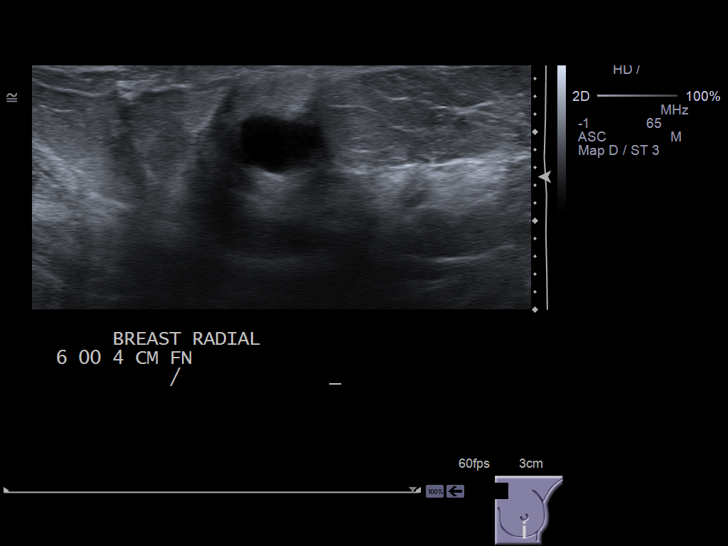
[im 6/17]
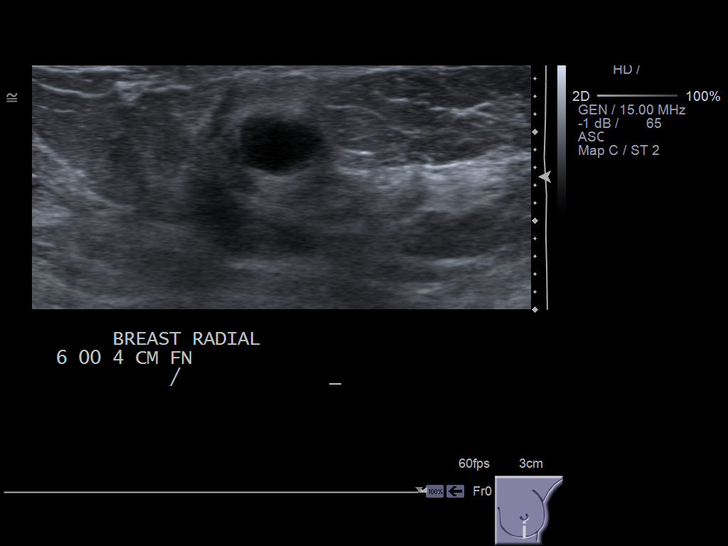
[im 8/17]
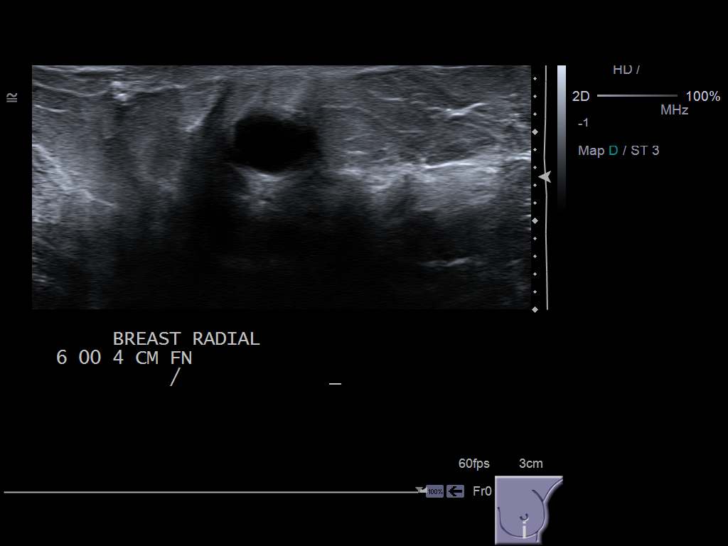
[im 9/17]
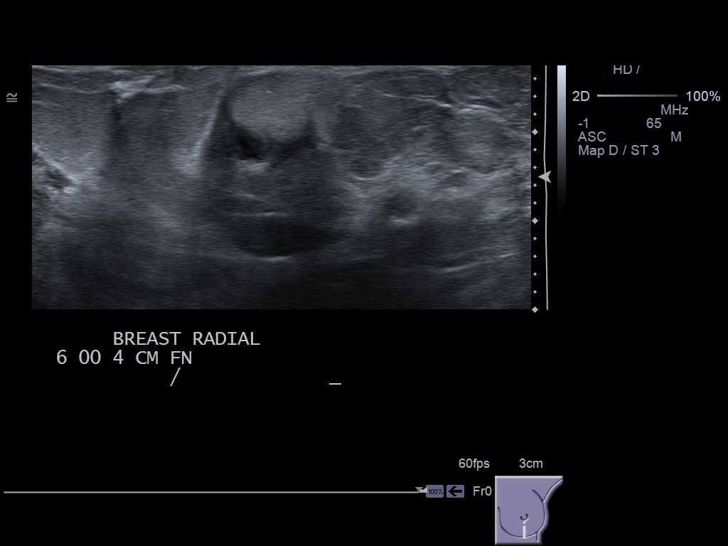
[im 10/17]
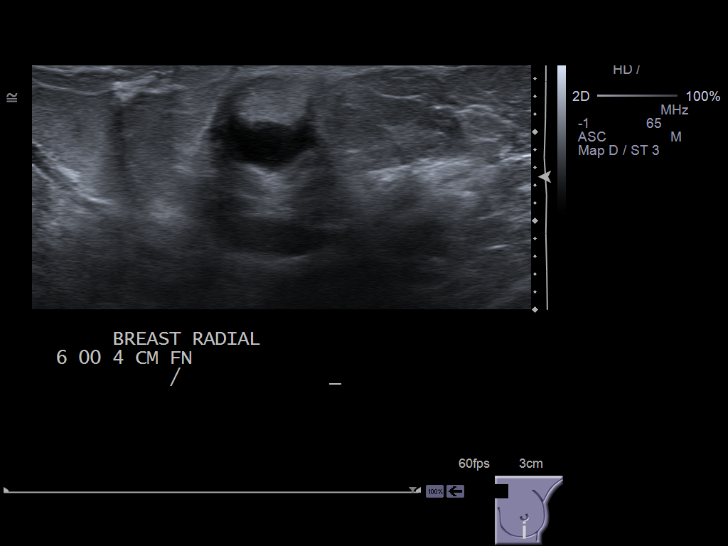
[im 12/17]
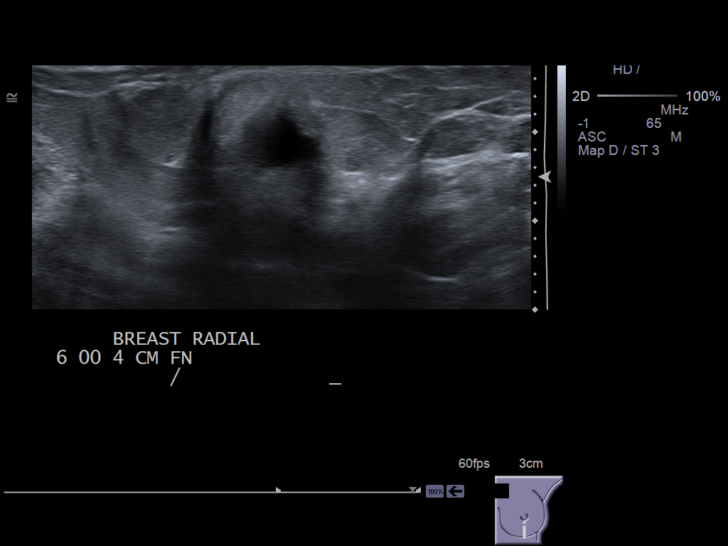
[im 13/17]
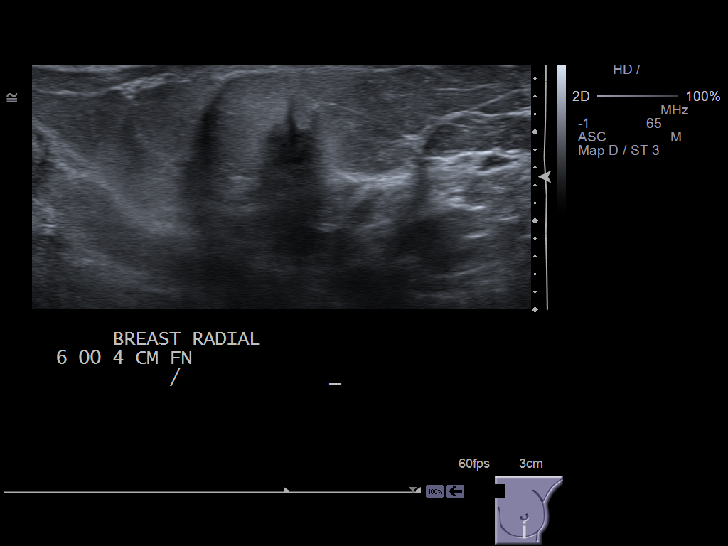
[im 14/17]
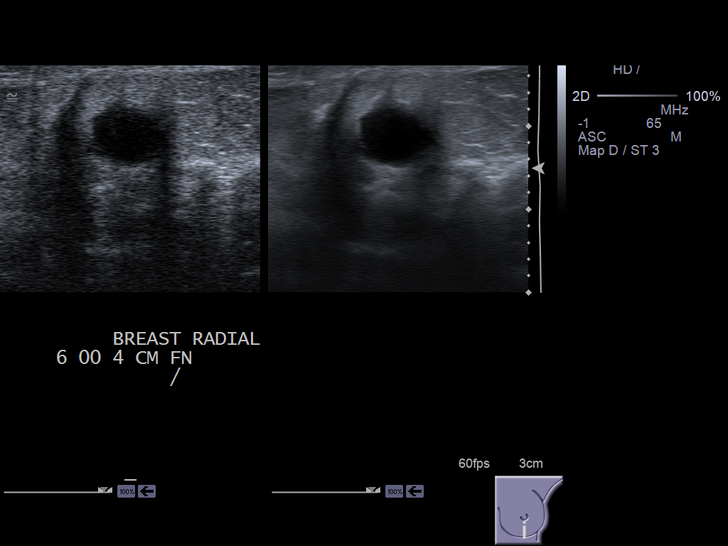
[im 16/17]
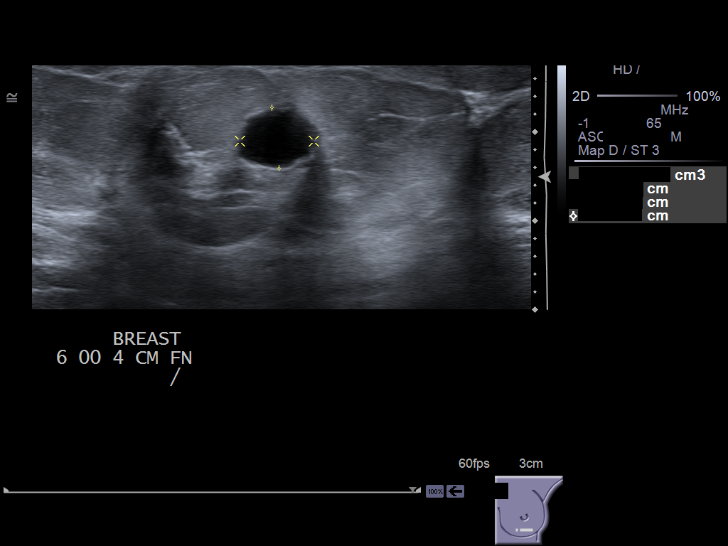
[im 17/17]
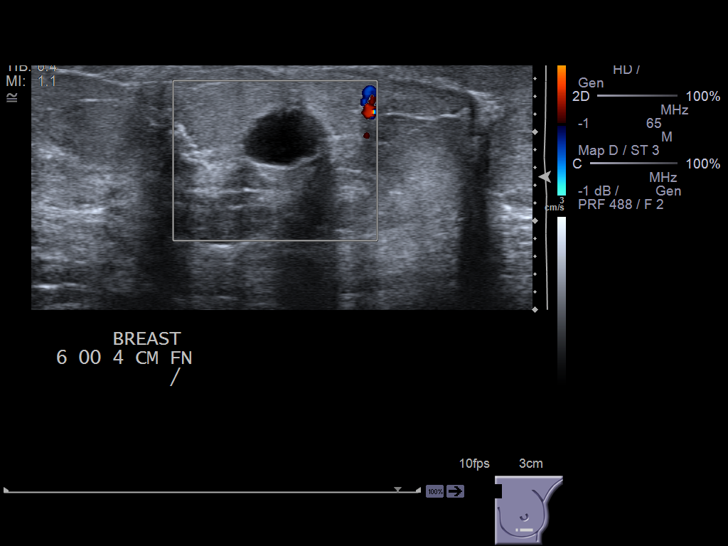

[13 of 17 positions shown; findings below may reference images not displayed]

FINDINGS: The breast tissue is heterogeneously dense, which may lower the sensitivity
of mammography. A palpable marker was placed along the inferior left breast
at approximately 6 o'clock secondary to reported palpable abnormality at
this site. Spot compression magnification views were performed of this
region. On the spot compression magnification MLO view, there is suggestion
of a mass at the site, though it is mostly obscured by overlapping
fibroglandular tissue. On the spot compression magnification CC view, there
appears to be only relatively dense fibroglandular tissue in this region.

Real-time ultrasound was performed of the inferior left breast at [DATE] at
the site of reported palpable abnormality. At this site, there is a round
mass which is hypoechoic, though nearly anechoic. There is a suggestion of
posterior acoustic though transmission, but this is not definitive. There is
some shadowing along the periphery of the mass and some of the borders are
somewhat ill-defined. The mass measures 1.0 x 0.8 x 0.7 cm.
IMPRESSION: 1.     BI-RADS: Category 4 - Suspicious Abnormality.
2.     There is a small mass in the inferior left breast at the site of
reported palpable abnormality which could represent a cyst, but this is not
definitive. Attempt at cyst aspiration is recommended, with potential for
biopsy if solid.

BREAST COMPOSITION:  The breast composition is HETEROGENEOUSLY DENSE
(glandular tissue is 51-75%). This may decrease the sensitivity of
mammography.

## 2014-10-06 ENCOUNTER — Emergency Department: Payer: Self-pay | Admitting: Emergency Medicine

## 2014-11-04 ENCOUNTER — Encounter: Payer: Self-pay | Admitting: *Deleted

## 2016-11-18 ENCOUNTER — Encounter: Payer: Self-pay | Admitting: Emergency Medicine

## 2016-11-18 ENCOUNTER — Emergency Department
Admission: EM | Admit: 2016-11-18 | Discharge: 2016-11-18 | Disposition: A | Discharge status: Home / Self Care | Payer: BLUE CROSS/BLUE SHIELD | Attending: Emergency Medicine | Admitting: Emergency Medicine

## 2016-11-18 ENCOUNTER — Emergency Department: Payer: BLUE CROSS/BLUE SHIELD

## 2016-11-18 DIAGNOSIS — K5790 Diverticulosis of intestine, part unspecified, without perforation or abscess without bleeding: Secondary | ICD-10-CM | POA: Diagnosis not present

## 2016-11-18 DIAGNOSIS — K5792 Diverticulitis of intestine, part unspecified, without perforation or abscess without bleeding: Secondary | ICD-10-CM

## 2016-11-18 DIAGNOSIS — R1030 Lower abdominal pain, unspecified: Secondary | ICD-10-CM | POA: Diagnosis present

## 2016-11-18 LAB — COMPREHENSIVE METABOLIC PANEL
ALK PHOS: 74 U/L (ref 38–126)
ALT: 41 U/L (ref 14–54)
AST: 38 U/L (ref 15–41)
Albumin: 4.4 g/dL (ref 3.5–5.0)
Anion gap: 9 (ref 5–15)
BILIRUBIN TOTAL: 0.9 mg/dL (ref 0.3–1.2)
BUN: 14 mg/dL (ref 6–20)
CALCIUM: 9.8 mg/dL (ref 8.9–10.3)
CHLORIDE: 101 mmol/L (ref 101–111)
CO2: 27 mmol/L (ref 22–32)
CREATININE: 0.65 mg/dL (ref 0.44–1.00)
Glucose, Bld: 117 mg/dL — ABNORMAL HIGH (ref 65–99)
Potassium: 4.2 mmol/L (ref 3.5–5.1)
Sodium: 137 mmol/L (ref 135–145)
TOTAL PROTEIN: 7.6 g/dL (ref 6.5–8.1)

## 2016-11-18 LAB — CBC
HEMATOCRIT: 39.2 % (ref 35.0–47.0)
HEMOGLOBIN: 13.4 g/dL (ref 12.0–16.0)
MCH: 28.8 pg (ref 26.0–34.0)
MCHC: 34.2 g/dL (ref 32.0–36.0)
MCV: 84.3 fL (ref 80.0–100.0)
Platelets: 231 10*3/uL (ref 150–440)
RBC: 4.66 MIL/uL (ref 3.80–5.20)
RDW: 14.5 % (ref 11.5–14.5)
WBC: 9.1 10*3/uL (ref 3.6–11.0)

## 2016-11-18 LAB — LIPASE, BLOOD: LIPASE: 17 U/L (ref 11–51)

## 2016-11-18 MED ORDER — ONDANSETRON 4 MG PO TBDP
ORAL_TABLET | ORAL | Status: AC
  Administered 2016-11-18: 4 mg via ORAL
  Filled 2016-11-18: qty 1

## 2016-11-18 MED ORDER — IOPAMIDOL (ISOVUE-300) INJECTION 61%
100.0000 mL | Freq: Once | INTRAVENOUS | Status: AC | PRN
  Administered 2016-11-18: 100 mL via INTRAVENOUS

## 2016-11-18 MED ORDER — HYDROCODONE-ACETAMINOPHEN 5-325 MG PO TABS: 1.0000 | ORAL_TABLET | ORAL | 0 refills | Status: AC | PRN

## 2016-11-18 MED ORDER — METRONIDAZOLE 500 MG PO TABS: 500.0000 mg | ORAL_TABLET | Freq: Three times a day (TID) | ORAL | 0 refills | Status: AC

## 2016-11-18 MED ORDER — CIPROFLOXACIN HCL 500 MG PO TABS
500.0000 mg | ORAL_TABLET | ORAL | Status: AC
  Administered 2016-11-18: 500 mg via ORAL
  Filled 2016-11-18: qty 1

## 2016-11-18 MED ORDER — ONDANSETRON 4 MG PO TBDP: ORAL_TABLET | ORAL | 0 refills | Status: AC

## 2016-11-18 MED ORDER — CIPROFLOXACIN HCL 500 MG PO TABS: 500.0000 mg | ORAL_TABLET | Freq: Two times a day (BID) | ORAL | 0 refills | Status: AC

## 2016-11-18 MED ORDER — DOCUSATE SODIUM 100 MG PO CAPS: ORAL_CAPSULE | ORAL | 0 refills | Status: AC

## 2016-11-18 MED ORDER — MORPHINE SULFATE (PF) 4 MG/ML IV SOLN
4.0000 mg | Freq: Once | INTRAVENOUS | Status: DC
  Filled 2016-11-18: qty 1

## 2016-11-18 MED ORDER — METRONIDAZOLE 500 MG PO TABS
500.0000 mg | ORAL_TABLET | Freq: Once | ORAL | Status: AC
  Administered 2016-11-18: 500 mg via ORAL
  Filled 2016-11-18: qty 1

## 2016-11-18 MED ORDER — MORPHINE SULFATE (PF) 2 MG/ML IV SOLN
2.0000 mg | Freq: Once | INTRAVENOUS | Status: AC
  Administered 2016-11-18: 2 mg via INTRAVENOUS
  Filled 2016-11-18: qty 1

## 2016-11-18 MED ORDER — ONDANSETRON 4 MG PO TBDP
4.0000 mg | ORAL_TABLET | Freq: Once | ORAL | Status: AC
  Administered 2016-11-18: 4 mg via ORAL

## 2016-11-18 MED ORDER — IOPAMIDOL (ISOVUE-300) INJECTION 61%
30.0000 mL | Freq: Once | INTRAVENOUS | Status: AC | PRN
  Administered 2016-11-18: 30 mL via ORAL

## 2016-11-18 NOTE — ED Provider Notes (Signed)
[facility] Emergency Department Provider Note  ____________________________________________   First MD Initiated Contact with Patient 11/18/16 0424     (approximate)  I have reviewed the triage vital signs and the nursing notes.   HISTORY  Chief Complaint Abdominal Pain    HPI Laura Holden is a 51 y.o. female with no significant chronic medical issues who presents by private vehicle for evaluation of gradual onset lower aching abdominal pain with 2 episodes of emesis that started about 10 hours ago.  She had no trauma or injury.  She was not doing anything out of the ordinary.  She reports that the pain is located in the center of her lower abdomen and radiates to her back.  She denies dysuria and hematuria.  She has some persistent nausea and has vomited twice.  She has had normal bowel movements with no diarrhea nor constipation.  She denies fever/chills, chest pain, shortness of breath.  She has not had symptoms similar to this in the past and has no history of surgery on her abdomen except for a prior cesarean section.  She has no history of kidney stones.  Nothing particular makes her symptoms better nor worse.   Past Medical History:  Diagnosis Date  . GERD (gastroesophageal reflux disease)     Patient Active Problem List   Diagnosis Date Noted  . Unspecified vitamin D deficiency 02/03/2013  . Elevated liver enzymes 01/23/2013  . Pain in joint, shoulder region 01/16/2013  . Routine general medical examination at a health care facility 01/16/2013  . Left breast mass 12/17/2012  . Headache, chronic daily 12/15/2011  . GERD (gastroesophageal reflux disease)   . Secondary amenorrhea 09/08/2011    History reviewed. No pertinent surgical history.  Prior to Admission medications   Medication Sig Start Date End Date Taking? Authorizing Provider  ALPRAZolam (XANAX) 0.25 MG tablet Take 1 tablet (0.25 mg total) by mouth at bedtime as needed for sleep  or anxiety. 12/21/12   Sherlene Shams, MD  Cholecalciferol (VITAMIN D) 2000 UNITS tablet Take 1 tablet (2,000 Units total) by mouth daily. 02/03/13   Sherlene Shams, MD  ciprofloxacin (CIPRO) 500 MG tablet Take 1 tablet (500 mg total) by mouth 2 (two) times daily. 11/18/16 11/28/16  Loleta Rose, MD  docusate sodium (COLACE) 100 MG capsule Take 1 tablet once or twice daily as needed for constipation while taking narcotic pain medicine 11/18/16   Loleta Rose, MD  HYDROcodone-acetaminophen (NORCO/VICODIN) 5-325 MG tablet Take 1-2 tablets by mouth every 4 (four) hours as needed for moderate pain. 11/18/16   Loleta Rose, MD  Lansoprazole (PREVACID PO) Take one by mouth daily as needed, pt not sure of dose.    Historical Provider, MD  metroNIDAZOLE (FLAGYL) 500 MG tablet Take 1 tablet (500 mg total) by mouth 3 (three) times daily. 11/18/16 11/28/16  Loleta Rose, MD  ondansetron (ZOFRAN ODT) 4 MG disintegrating tablet Allow 1-2 tablets to dissolve in your mouth every 8 hours as needed for nausea/vomiting 11/18/16   Loleta Rose, MD  potassium chloride (K-DUR,KLOR-CON) 10 MEQ tablet Take 2 tablets (20 mEq total) by mouth daily. 01/16/13   Sherlene Shams, MD    Allergies Patient has no known allergies.  Family History  Problem Relation Age of Onset  . Cancer Neg Hx   . Diabetes Neg Hx   . Heart disease Neg Hx     Social History Social History  Substance Use Topics  . Smoking status: Never Smoker  .  Smokeless tobacco: Never Used  . Alcohol use No    Review of Systems Constitutional: No fever/chills Eyes: No visual changes. ENT: No sore throat. Cardiovascular: Denies chest pain. Respiratory: Denies shortness of breath. Gastrointestinal: Lower middle abdominal pain with 2 episodes of emesis.  No diarrhea nor constipation. Genitourinary: Negative for dysuria and hematuria Musculoskeletal: Back pain, presumably radiating from her abdomen Skin: Negative for rash. Neurological: Negative for  headaches, focal weakness or numbness.  10-point ROS otherwise negative.  ____________________________________________   PHYSICAL EXAM:  VITAL SIGNS: ED Triage Vitals [11/18/16 0359]  Enc Vitals Group     BP 130/76     Pulse Rate 71     Resp 18     Temp 97.8 F (36.6 C)     Temp Source Oral     SpO2 100 %     Weight 142 lb (64.4 kg)     Height  (1.575 m)     Head Circumference      Peak Flow      Pain Score 6     Pain Loc      Pain Edu?      Excl. in GC?     Constitutional: Alert and oriented. No acute distress but does appear uncomfortable. Eyes: Conjunctivae are normal. PERRL. EOMI. Head: Atraumatic. Nose: No congestion/rhinnorhea. Mouth/Throat: Mucous membranes are moist. Neck: No stridor.  No meningeal signs.   Cardiovascular: Normal rate, regular rhythm. Good peripheral circulation. Grossly normal heart sounds. Respiratory: Normal respiratory effort.  No retractions. Lungs CTAB. Gastrointestinal: Soft, no bruit nor pulsatile mass.  Moderate tenderness to palpation in the lower middle part of her abdomen.  No rebound or guarding.  No upper abdominal tenderness to palpation.  Negative Rovsing sign Genitourinary: Deferred Musculoskeletal: No lower extremity tenderness nor edema. No gross deformities of extremities. Neurologic:  Normal speech and language. No gross focal neurologic deficits are appreciated.  Skin:  Skin is warm, dry and intact. No rash noted.   ____________________________________________   LABS (all labs ordered are listed, but only abnormal results are displayed)  Labs Reviewed  COMPREHENSIVE METABOLIC PANEL - Abnormal; Notable for the following:       Result Value   Glucose, Bld 117 (*)    All other components within normal limits  LIPASE, BLOOD  CBC  URINALYSIS, COMPLETE (UACMP) WITH MICROSCOPIC   ____________________________________________  EKG  None - EKG not ordered by ED  physician ____________________________________________  RADIOLOGY   Ct Abdomen Pelvis W Contrast  Result Date: 11/18/2016 CLINICAL DATA:  Lower abdominal pain with nausea and vomiting since yesterday evening. EXAM: CT ABDOMEN AND PELVIS WITH CONTRAST TECHNIQUE: Multidetector CT imaging of the abdomen and pelvis was performed using the standard protocol following bolus administration of intravenous contrast. CONTRAST:  ISOVUE-300 IOPAMIDOL (ISOVUE-300) INJECTION 61% COMPARISON:  None. FINDINGS: Lower chest: Lung bases are clear. Residual contrast material in the esophagus suggest reflux or dysmotility. Hepatobiliary: No focal liver abnormality is seen. No gallstones, gallbladder wall thickening, or biliary dilatation. Pancreas: Unremarkable. No pancreatic ductal dilatation or surrounding inflammatory changes. Spleen: Normal in size without focal abnormality. Adrenals/Urinary Tract: Adrenal glands are unremarkable. Kidneys are normal, without renal calculi, focal lesion, or hydronephrosis. Bladder is unremarkable. Stomach/Bowel: Diverticulosis of the sigmoid colon. Inflammatory changes in the pericolonic fat at the junction of the descending and sigmoid region consistent with acute diverticulitis. No abscess. Stomach and small bowel are not abnormally distended. Appendix is normal. Vascular/Lymphatic: Aortic atherosclerosis. No enlarged abdominal or pelvic lymph nodes. Reproductive:  Uterus and bilateral adnexa are unremarkable. Other: Small amount of free fluid in the pelvis is likely reactive. No free air in the abdomen. Abdominal wall musculature appears intact. Musculoskeletal: No acute or significant osseous findings. IMPRESSION: Diverticulosis of the sigmoid colon with focal area of diverticulitis. No abscess. No bowel obstruction. Electronically Signed   By: Burman Nieves M.D.   On: 11/18/2016 06:23    ____________________________________________   PROCEDURES  Critical Care performed:  No   Procedure(s) performed:   Procedures   ____________________________________________   INITIAL IMPRESSION / ASSESSMENT AND PLAN / ED COURSE  Pertinent labs & imaging results that were available during my care of the patient were reviewed by me and considered in my medical decision making (see chart for details).  The patient does appear uncomfortable and has lower abdominal tenderness to palpation radiating to her back.  She has also had several episodes of vomiting.  Her presentation is not consistent with an obstructive pattern.  She may be suffering from a kidney stone and renal colic, appendicitis, or other intra-abdominal pathology.  Due to the circumstances I believe it is reasonable to proceed with a CT scan of her abdomen and pelvis with oral and IV contrast for optimal evaluation.  I am providing morphine and Zofran.  There is no indication for fluids at this point with only 2 episodes of vomiting and normal vital signs. Patient agrees with the plan.       ____________________________________________  FINAL CLINICAL IMPRESSION(S) / ED DIAGNOSES  Final diagnoses:  Diverticulitis     MEDICATIONS GIVEN DURING THIS VISIT:  Medications  ciprofloxacin (CIPRO) tablet 500 mg (not administered)  metroNIDAZOLE (FLAGYL) tablet 500 mg (not administered)  ondansetron (ZOFRAN-ODT) disintegrating tablet 4 mg (4 mg Oral Given 11/18/16 0403)  morphine 2 MG/ML injection 2 mg (2 mg Intravenous Given 11/18/16 0457)  iopamidol (ISOVUE-300) 61 % injection 30 mL (30 mLs Oral Contrast Given 11/18/16 0506)  iopamidol (ISOVUE-300) 61 % injection 100 mL (100 mLs Intravenous Contrast Given 11/18/16 0606)     NEW OUTPATIENT MEDICATIONS STARTED DURING THIS VISIT:  New Prescriptions   CIPROFLOXACIN (CIPRO) 500 MG TABLET    Take 1 tablet (500 mg total) by mouth 2 (two) times daily.   DOCUSATE SODIUM (COLACE) 100 MG CAPSULE    Take 1 tablet once or twice daily as needed for constipation while  taking narcotic pain medicine   HYDROCODONE-ACETAMINOPHEN (NORCO/VICODIN) 5-325 MG TABLET    Take 1-2 tablets by mouth every 4 (four) hours as needed for moderate pain.   METRONIDAZOLE (FLAGYL) 500 MG TABLET    Take 1 tablet (500 mg total) by mouth 3 (three) times daily.   ONDANSETRON (ZOFRAN ODT) 4 MG DISINTEGRATING TABLET    Allow 1-2 tablets to dissolve in your mouth every 8 hours as needed for nausea/vomiting    Modified Medications   No medications on file    Discontinued Medications   No medications on file     Note:  This document was prepared using Dragon voice recognition software and may include unintentional dictation errors.    Loleta Rose, MD 11/18/16 [phone]

## 2016-11-18 NOTE — Discharge Instructions (Signed)
We believe your symptoms are caused by diverticulitis.  Most of the time this condition (please read through the included information) can be cured with outpatient antibiotics.  Please take the full course of prescribed medication(s) and follow up with the doctors recommended above.  Return to the ED if your abdominal pain worsens or fails to improve, you develop bloody vomiting, bloody diarrhea, you are unable to tolerate fluids due to vomiting, fever greater than 101, or other symptoms that concern you.  Take Norco as prescribed for severe pain. Do not drink alcohol, drive or participate in any other potentially dangerous activities while taking this medication as it may make you sleepy. Do not take this medication with any other sedating medications, either prescription or over-the-counter. If you were prescribed Percocet or Vicodin, do not take these with acetaminophen (Tylenol) as it is already contained within these medications.   This medication is an opiate (or narcotic) pain medication and can be habit forming.  Use it as little as possible to achieve adequate pain control.  Do not use or use it with extreme caution if you have a history of opiate abuse or dependence.  If you are on a pain contract with your primary care doctor or a pain specialist, be sure to let them know you were prescribed this medication today from the [facility].  This medication is intended for your use only - do not give any to anyone else and keep it in a secure place where nobody else, especially children, have access to it.  It will also cause or worsen constipation, so you may want to consider taking an over-the-counter stool softener while you are taking this medication.  

## 2016-11-18 NOTE — ED Notes (Signed)
Reviewed d/c instructions, follow-up instructions, prescriptions with patient. Pt verbalized understanding.   Patient being driven home by family

## 2016-11-18 NOTE — ED Notes (Signed)
Patient reports she has never taken stronger pain medication before. Pt requesting smaller dose of medication. MD York Cerise informed.

## 2016-11-18 NOTE — ED Triage Notes (Signed)
Pt ambulatory to triage in NAD, report lower abd pain and n/v since yesterday evening, reports one episode of vomiting.  Reports back pain as well. Denies any changes in urine characteristics.

## 2019-09-26 ENCOUNTER — Other Ambulatory Visit: Payer: Self-pay | Admitting: Internal Medicine

## 2019-09-26 DIAGNOSIS — N63 Unspecified lump in unspecified breast: Secondary | ICD-10-CM

## 2019-09-26 DIAGNOSIS — Z1231 Encounter for screening mammogram for malignant neoplasm of breast: Secondary | ICD-10-CM

## 2019-10-24 ENCOUNTER — Ambulatory Visit
Admission: RE | Admit: 2019-10-24 | Discharge: 2019-10-24 | Disposition: A | Discharge status: Home / Self Care | Payer: BC Managed Care – PPO | Source: Ambulatory Visit | Attending: Internal Medicine | Admitting: Internal Medicine

## 2019-10-24 DIAGNOSIS — N63 Unspecified lump in unspecified breast: Secondary | ICD-10-CM | POA: Diagnosis not present

## 2019-10-24 DIAGNOSIS — Z1231 Encounter for screening mammogram for malignant neoplasm of breast: Secondary | ICD-10-CM | POA: Diagnosis present

## 2021-05-26 ENCOUNTER — Other Ambulatory Visit: Payer: Self-pay

## 2021-05-26 MED ORDER — LEVOTHYROXINE SODIUM 50 MCG PO TABS
ORAL_TABLET | ORAL | 10 refills | Status: AC
  Filled 2021-06-11: qty 30, 30d supply, fill #0

## 2021-05-26 MED ORDER — ROSUVASTATIN CALCIUM 20 MG PO TABS
ORAL_TABLET | ORAL | 10 refills | Status: AC
  Filled 2021-05-26: qty 90, 90d supply, fill #0

## 2021-05-27 ENCOUNTER — Other Ambulatory Visit: Payer: Self-pay

## 2021-06-10 ENCOUNTER — Other Ambulatory Visit: Payer: Self-pay

## 2021-06-10 MED ORDER — METHYLPREDNISOLONE 4 MG PO TBPK
ORAL_TABLET | ORAL | 0 refills | Status: AC
  Filled 2021-06-10 (×2): qty 21, 6d supply, fill #0
  Filled 2021-06-10: qty 1, 6d supply, fill #0

## 2021-06-11 ENCOUNTER — Other Ambulatory Visit: Payer: Self-pay

## 2022-03-29 ENCOUNTER — Other Ambulatory Visit: Payer: Self-pay | Admitting: Internal Medicine

## 2022-03-29 DIAGNOSIS — Z1231 Encounter for screening mammogram for malignant neoplasm of breast: Secondary | ICD-10-CM

## 2022-04-12 ENCOUNTER — Other Ambulatory Visit: Payer: Self-pay

## 2022-04-12 MED ORDER — LEVOTHYROXINE SODIUM 50 MCG PO TABS
ORAL_TABLET | ORAL | 1 refills | Status: AC
  Filled 2022-04-12: qty 90, 90d supply, fill #0
  Filled 2022-07-11: qty 90, 90d supply, fill #1

## 2022-04-12 MED ORDER — PANTOPRAZOLE SODIUM 20 MG PO TBEC
DELAYED_RELEASE_TABLET | ORAL | 1 refills | Status: AC
Start: 2022-04-12 — End: ?
  Filled 2022-04-12: qty 90, 90d supply, fill #0

## 2022-04-13 ENCOUNTER — Other Ambulatory Visit: Payer: Self-pay

## 2022-04-14 ENCOUNTER — Other Ambulatory Visit (HOSPITAL_COMMUNITY): Payer: Self-pay

## 2022-04-14 ENCOUNTER — Other Ambulatory Visit: Payer: Self-pay

## 2022-04-18 ENCOUNTER — Other Ambulatory Visit: Payer: Self-pay

## 2022-04-26 ENCOUNTER — Ambulatory Visit
Admission: RE | Admit: 2022-04-26 | Discharge: 2022-04-26 | Disposition: A | Discharge status: Home / Self Care | Payer: No Typology Code available for payment source | Source: Ambulatory Visit | Attending: Internal Medicine | Admitting: Internal Medicine

## 2022-04-26 DIAGNOSIS — Z1231 Encounter for screening mammogram for malignant neoplasm of breast: Secondary | ICD-10-CM | POA: Diagnosis present

## 2022-07-11 ENCOUNTER — Other Ambulatory Visit: Payer: Self-pay

## 2022-07-12 ENCOUNTER — Other Ambulatory Visit: Payer: Self-pay

## 2022-12-20 ENCOUNTER — Other Ambulatory Visit: Payer: Self-pay

## 2022-12-20 MED ORDER — ROSUVASTATIN CALCIUM 10 MG PO TABS
10.0000 mg | ORAL_TABLET | Freq: Every day | ORAL | 3 refills | Status: AC
  Filled 2022-12-20: qty 90, 90d supply, fill #0

## 2022-12-23 ENCOUNTER — Other Ambulatory Visit: Payer: Self-pay

## 2023-03-27 ENCOUNTER — Other Ambulatory Visit: Payer: Self-pay | Admitting: Internal Medicine

## 2023-03-27 DIAGNOSIS — Z1231 Encounter for screening mammogram for malignant neoplasm of breast: Secondary | ICD-10-CM

## 2023-03-29 ENCOUNTER — Other Ambulatory Visit: Payer: Self-pay

## 2023-03-29 DIAGNOSIS — Z780 Asymptomatic menopausal state: Secondary | ICD-10-CM | POA: Diagnosis not present

## 2023-03-29 DIAGNOSIS — R202 Paresthesia of skin: Secondary | ICD-10-CM | POA: Diagnosis not present

## 2023-03-29 DIAGNOSIS — E782 Mixed hyperlipidemia: Secondary | ICD-10-CM | POA: Diagnosis not present

## 2023-03-29 DIAGNOSIS — Z0000 Encounter for general adult medical examination without abnormal findings: Secondary | ICD-10-CM | POA: Diagnosis not present

## 2023-03-29 DIAGNOSIS — E039 Hypothyroidism, unspecified: Secondary | ICD-10-CM | POA: Diagnosis not present

## 2023-03-29 DIAGNOSIS — K219 Gastro-esophageal reflux disease without esophagitis: Secondary | ICD-10-CM | POA: Diagnosis not present

## 2023-03-29 MED ORDER — ROSUVASTATIN CALCIUM 5 MG PO TABS
5.0000 mg | ORAL_TABLET | Freq: Every day | ORAL | 3 refills | Status: AC
  Filled 2023-03-29: qty 90, 90d supply, fill #0
  Filled 2023-07-12: qty 90, 90d supply, fill #1
  Filled 2023-11-03: qty 90, 90d supply, fill #2

## 2023-04-28 ENCOUNTER — Ambulatory Visit
Admission: RE | Admit: 2023-04-28 | Discharge: 2023-04-28 | Disposition: A | Discharge status: Home / Self Care | Payer: 59 | Source: Ambulatory Visit | Attending: Internal Medicine | Admitting: Internal Medicine

## 2023-04-28 DIAGNOSIS — Z1231 Encounter for screening mammogram for malignant neoplasm of breast: Secondary | ICD-10-CM | POA: Insufficient documentation

## 2023-05-04 ENCOUNTER — Other Ambulatory Visit: Payer: Self-pay

## 2023-05-04 MED ORDER — LEVOTHYROXINE SODIUM 50 MCG PO TABS
50.0000 ug | ORAL_TABLET | Freq: Every day | ORAL | 1 refills | Status: DC
  Filled 2023-05-04: qty 90, 90d supply, fill #0
  Filled 2023-08-03: qty 90, 90d supply, fill #1

## 2023-07-12 ENCOUNTER — Other Ambulatory Visit: Payer: Self-pay

## 2023-07-14 ENCOUNTER — Other Ambulatory Visit: Payer: Self-pay

## 2023-08-03 ENCOUNTER — Other Ambulatory Visit: Payer: Self-pay

## 2023-08-04 ENCOUNTER — Other Ambulatory Visit: Payer: Self-pay

## 2023-08-15 ENCOUNTER — Other Ambulatory Visit: Payer: Self-pay

## 2023-11-03 ENCOUNTER — Other Ambulatory Visit: Payer: Self-pay

## 2023-11-03 MED ORDER — LEVOTHYROXINE SODIUM 50 MCG PO TABS
50.0000 ug | ORAL_TABLET | Freq: Every day | ORAL | 1 refills | Status: DC
  Filled 2023-11-03: qty 90, 90d supply, fill #0
  Filled 2024-02-05: qty 90, 90d supply, fill #1

## 2024-02-05 ENCOUNTER — Other Ambulatory Visit: Payer: Self-pay

## 2024-02-06 ENCOUNTER — Other Ambulatory Visit: Payer: Self-pay

## 2024-03-20 DIAGNOSIS — Z780 Asymptomatic menopausal state: Secondary | ICD-10-CM | POA: Diagnosis not present

## 2024-03-20 DIAGNOSIS — Z0000 Encounter for general adult medical examination without abnormal findings: Secondary | ICD-10-CM | POA: Diagnosis not present

## 2024-03-20 DIAGNOSIS — R202 Paresthesia of skin: Secondary | ICD-10-CM | POA: Diagnosis not present

## 2024-03-20 DIAGNOSIS — E782 Mixed hyperlipidemia: Secondary | ICD-10-CM | POA: Diagnosis not present

## 2024-03-20 DIAGNOSIS — E039 Hypothyroidism, unspecified: Secondary | ICD-10-CM | POA: Diagnosis not present

## 2024-03-29 ENCOUNTER — Other Ambulatory Visit: Payer: Self-pay

## 2024-03-29 ENCOUNTER — Other Ambulatory Visit: Payer: Self-pay | Admitting: Internal Medicine

## 2024-03-29 DIAGNOSIS — Z780 Asymptomatic menopausal state: Secondary | ICD-10-CM | POA: Diagnosis not present

## 2024-03-29 DIAGNOSIS — Z1331 Encounter for screening for depression: Secondary | ICD-10-CM | POA: Diagnosis not present

## 2024-03-29 DIAGNOSIS — E559 Vitamin D deficiency, unspecified: Secondary | ICD-10-CM | POA: Diagnosis not present

## 2024-03-29 DIAGNOSIS — E782 Mixed hyperlipidemia: Secondary | ICD-10-CM | POA: Diagnosis not present

## 2024-03-29 DIAGNOSIS — Z1231 Encounter for screening mammogram for malignant neoplasm of breast: Secondary | ICD-10-CM

## 2024-03-29 DIAGNOSIS — Z0000 Encounter for general adult medical examination without abnormal findings: Secondary | ICD-10-CM | POA: Diagnosis not present

## 2024-03-29 DIAGNOSIS — E039 Hypothyroidism, unspecified: Secondary | ICD-10-CM | POA: Diagnosis not present

## 2024-03-29 MED ORDER — ROSUVASTATIN CALCIUM 5 MG PO TABS
5.0000 mg | ORAL_TABLET | Freq: Every day | ORAL | 3 refills | Status: AC
  Filled 2024-03-29: qty 90, 90d supply, fill #0
  Filled 2024-08-06: qty 90, 90d supply, fill #1

## 2024-04-04 ENCOUNTER — Other Ambulatory Visit: Payer: Self-pay

## 2024-04-30 ENCOUNTER — Ambulatory Visit
Admission: RE | Admit: 2024-04-30 | Discharge: 2024-04-30 | Disposition: A | Discharge status: Home / Self Care | Source: Ambulatory Visit | Attending: Internal Medicine | Admitting: Internal Medicine

## 2024-04-30 DIAGNOSIS — Z1231 Encounter for screening mammogram for malignant neoplasm of breast: Secondary | ICD-10-CM | POA: Diagnosis not present

## 2024-05-06 ENCOUNTER — Other Ambulatory Visit: Payer: Self-pay

## 2024-05-06 MED ORDER — LEVOTHYROXINE SODIUM 50 MCG PO TABS
50.0000 ug | ORAL_TABLET | Freq: Every day | ORAL | 1 refills | Status: AC
  Filled 2024-05-06: qty 90, 90d supply, fill #0
  Filled 2024-08-06: qty 30, 30d supply, fill #1
  Filled 2024-08-06: qty 90, 90d supply, fill #1

## 2024-05-07 ENCOUNTER — Other Ambulatory Visit: Payer: Self-pay

## 2024-08-06 ENCOUNTER — Other Ambulatory Visit: Payer: Self-pay

## 2024-08-12 ENCOUNTER — Other Ambulatory Visit: Payer: Self-pay

## 2024-08-28 ENCOUNTER — Other Ambulatory Visit: Payer: Self-pay
